# Patient Record
Sex: Female | Born: 1965 | Race: White | Hispanic: No | Marital: Married | Smoking: Former smoker
Health system: Southern US, Community
[De-identification: ages and names within clinical notes are randomized; demographics above are authoritative.]

## PROBLEM LIST (undated history)

## (undated) DIAGNOSIS — F32A Depression, unspecified: Secondary | ICD-10-CM

## (undated) DIAGNOSIS — T7840XA Allergy, unspecified, initial encounter: Secondary | ICD-10-CM

## (undated) DIAGNOSIS — F1010 Alcohol abuse, uncomplicated: Secondary | ICD-10-CM

## (undated) DIAGNOSIS — F17200 Nicotine dependence, unspecified, uncomplicated: Secondary | ICD-10-CM

## (undated) DIAGNOSIS — G4700 Insomnia, unspecified: Secondary | ICD-10-CM

## (undated) DIAGNOSIS — E079 Disorder of thyroid, unspecified: Secondary | ICD-10-CM

## (undated) DIAGNOSIS — F419 Anxiety disorder, unspecified: Secondary | ICD-10-CM

## (undated) DIAGNOSIS — K219 Gastro-esophageal reflux disease without esophagitis: Secondary | ICD-10-CM

## (undated) DIAGNOSIS — N63 Unspecified lump in unspecified breast: Secondary | ICD-10-CM

## (undated) DIAGNOSIS — F329 Major depressive disorder, single episode, unspecified: Secondary | ICD-10-CM

## (undated) DIAGNOSIS — D239 Other benign neoplasm of skin, unspecified: Secondary | ICD-10-CM

## (undated) DIAGNOSIS — R1013 Epigastric pain: Secondary | ICD-10-CM

## (undated) DIAGNOSIS — G43719 Chronic migraine without aura, intractable, without status migrainosus: Principal | ICD-10-CM

## (undated) DIAGNOSIS — K3189 Other diseases of stomach and duodenum: Secondary | ICD-10-CM

## (undated) HISTORY — DX: Anxiety disorder, unspecified: F41.9

## (undated) HISTORY — DX: Alcohol abuse, uncomplicated: F10.10

## (undated) HISTORY — DX: Allergy, unspecified, initial encounter: T78.40XA

## (undated) HISTORY — DX: Gastro-esophageal reflux disease without esophagitis: K21.9

## (undated) HISTORY — DX: Other benign neoplasm of skin, unspecified: D23.9

## (undated) HISTORY — DX: Unspecified lump in unspecified breast: N63.0

## (undated) HISTORY — DX: Major depressive disorder, single episode, unspecified: F32.9

## (undated) HISTORY — DX: Nicotine dependence, unspecified, uncomplicated: F17.200

## (undated) HISTORY — DX: Chronic migraine without aura, intractable, without status migrainosus: G43.719

## (undated) HISTORY — DX: Other diseases of stomach and duodenum: K31.89

## (undated) HISTORY — DX: Insomnia, unspecified: G47.00

## (undated) HISTORY — DX: Disorder of thyroid, unspecified: E07.9

## (undated) HISTORY — DX: Depression, unspecified: F32.A

## (undated) HISTORY — DX: Epigastric pain: R10.13

---

## 1997-09-08 ENCOUNTER — Emergency Department (HOSPITAL_COMMUNITY): Admission: EM | Admit: 1997-09-08 | Discharge: 1997-09-09 | Payer: Self-pay | Admitting: Emergency Medicine

## 1997-09-25 ENCOUNTER — Other Ambulatory Visit: Admission: RE | Admit: 1997-09-25 | Discharge: 1997-09-25 | Payer: Self-pay | Admitting: Gynecology

## 1998-04-22 ENCOUNTER — Emergency Department (HOSPITAL_COMMUNITY): Admission: EM | Admit: 1998-04-22 | Discharge: 1998-04-22 | Payer: Self-pay | Admitting: *Deleted

## 2001-07-04 ENCOUNTER — Emergency Department (HOSPITAL_COMMUNITY): Admission: EM | Admit: 2001-07-04 | Discharge: 2001-07-04 | Payer: Self-pay | Admitting: Emergency Medicine

## 2004-06-17 ENCOUNTER — Encounter: Admission: RE | Admit: 2004-06-17 | Discharge: 2004-06-17 | Payer: Self-pay | Admitting: Family Medicine

## 2004-08-17 ENCOUNTER — Other Ambulatory Visit: Admission: RE | Admit: 2004-08-17 | Discharge: 2004-08-17 | Payer: Self-pay | Admitting: Obstetrics and Gynecology

## 2005-06-29 ENCOUNTER — Ambulatory Visit (HOSPITAL_COMMUNITY): Admission: RE | Admit: 2005-06-29 | Discharge: 2005-06-29 | Payer: Self-pay | Admitting: Obstetrics and Gynecology

## 2005-07-11 ENCOUNTER — Emergency Department (HOSPITAL_COMMUNITY): Admission: EM | Admit: 2005-07-11 | Discharge: 2005-07-11 | Payer: Self-pay | Admitting: Emergency Medicine

## 2005-07-19 ENCOUNTER — Encounter: Admission: RE | Admit: 2005-07-19 | Discharge: 2005-07-19 | Payer: Self-pay | Admitting: Gastroenterology

## 2005-09-08 ENCOUNTER — Other Ambulatory Visit: Admission: RE | Admit: 2005-09-08 | Discharge: 2005-09-08 | Payer: Self-pay | Admitting: Obstetrics and Gynecology

## 2006-01-03 ENCOUNTER — Encounter: Admission: RE | Admit: 2006-01-03 | Discharge: 2006-01-03 | Payer: Self-pay | Admitting: Obstetrics and Gynecology

## 2006-03-06 ENCOUNTER — Emergency Department (HOSPITAL_COMMUNITY): Admission: EM | Admit: 2006-03-06 | Discharge: 2006-03-06 | Payer: Self-pay | Admitting: Emergency Medicine

## 2006-04-11 HISTORY — PX: ABDOMINAL HYSTERECTOMY: SHX81

## 2007-01-09 ENCOUNTER — Other Ambulatory Visit: Admission: RE | Admit: 2007-01-09 | Discharge: 2007-01-09 | Payer: Self-pay | Admitting: Obstetrics and Gynecology

## 2007-02-02 ENCOUNTER — Encounter: Admission: RE | Admit: 2007-02-02 | Discharge: 2007-02-02 | Payer: Self-pay | Admitting: Family Medicine

## 2007-03-27 ENCOUNTER — Ambulatory Visit (HOSPITAL_BASED_OUTPATIENT_CLINIC_OR_DEPARTMENT_OTHER): Admission: RE | Admit: 2007-03-27 | Discharge: 2007-03-27 | Payer: Self-pay | Admitting: Obstetrics and Gynecology

## 2007-03-27 ENCOUNTER — Encounter: Payer: Self-pay | Admitting: Obstetrics and Gynecology

## 2008-02-20 ENCOUNTER — Encounter: Admission: RE | Admit: 2008-02-20 | Discharge: 2008-02-20 | Payer: Self-pay | Admitting: Family Medicine

## 2008-02-20 ENCOUNTER — Ambulatory Visit: Payer: Self-pay | Admitting: Obstetrics and Gynecology

## 2008-02-20 ENCOUNTER — Encounter: Payer: Self-pay | Admitting: Obstetrics and Gynecology

## 2008-02-20 ENCOUNTER — Other Ambulatory Visit: Admission: RE | Admit: 2008-02-20 | Discharge: 2008-02-20 | Payer: Self-pay | Admitting: Obstetrics and Gynecology

## 2008-03-14 ENCOUNTER — Encounter: Admission: RE | Admit: 2008-03-14 | Discharge: 2008-03-14 | Payer: Self-pay | Admitting: Family Medicine

## 2008-04-29 ENCOUNTER — Ambulatory Visit: Payer: Self-pay | Admitting: *Deleted

## 2008-04-29 ENCOUNTER — Ambulatory Visit: Payer: Self-pay | Admitting: Cardiovascular Disease

## 2008-04-29 ENCOUNTER — Observation Stay (HOSPITAL_COMMUNITY): Admission: EM | Admit: 2008-04-29 | Discharge: 2008-04-30 | Payer: Self-pay | Admitting: Emergency Medicine

## 2008-04-30 ENCOUNTER — Encounter (INDEPENDENT_AMBULATORY_CARE_PROVIDER_SITE_OTHER): Payer: Self-pay | Admitting: *Deleted

## 2010-03-03 ENCOUNTER — Encounter: Admission: RE | Admit: 2010-03-03 | Discharge: 2010-03-03 | Payer: Self-pay | Admitting: Family Medicine

## 2010-05-02 ENCOUNTER — Encounter: Payer: Self-pay | Admitting: Family Medicine

## 2010-07-26 LAB — CBC
HCT: 41.5 % (ref 36.0–46.0)
HCT: 44.2 % (ref 36.0–46.0)
Hemoglobin: 13.8 g/dL (ref 12.0–15.0)
MCHC: 33.2 g/dL (ref 30.0–36.0)
MCHC: 33.4 g/dL (ref 30.0–36.0)
MCV: 90.8 fL (ref 78.0–100.0)
Platelets: 308 10*3/uL (ref 150–400)
RDW: 13.1 % (ref 11.5–15.5)
RDW: 13.7 % (ref 11.5–15.5)
WBC: 11.6 10*3/uL — ABNORMAL HIGH (ref 4.0–10.5)

## 2010-07-26 LAB — LIPID PANEL
HDL: 40 mg/dL (ref >39)
LDL Cholesterol: 135 mg/dL — ABNORMAL HIGH (ref 0–99)
Total CHOL/HDL Ratio: 5.2 RATIO
Triglycerides: 156 mg/dL — ABNORMAL HIGH (ref <150)
VLDL: 31 mg/dL (ref 0–40)

## 2010-07-26 LAB — POCT I-STAT, CHEM 8
BUN: 7 mg/dL (ref 6–23)
Creatinine, Ser: 0.6 mg/dL (ref 0.4–1.2)
Glucose, Bld: 79 mg/dL (ref 70–99)
Potassium: 3.1 mEq/L — ABNORMAL LOW (ref 3.5–5.1)
Sodium: 137 mEq/L (ref 135–145)

## 2010-07-26 LAB — CARDIAC PANEL(CRET KIN+CKTOT+MB+TROPI)
CK, MB: 0.9 ng/mL (ref 0.3–4.0)
Relative Index: INVALID (ref 0.0–2.5)
Total CK: 36 U/L (ref 7–177)
Total CK: 41 U/L (ref 7–177)
Troponin I: <0.01 ng/mL (ref 0.00–0.06)

## 2010-07-26 LAB — POCT CARDIAC MARKERS
CKMB, poc: <1 ng/mL — ABNORMAL LOW (ref 1.0–8.0)
Myoglobin, poc: 36.5 ng/mL (ref 12–200)
Troponin i, poc: <0.05 ng/mL (ref 0.00–0.09)

## 2010-07-26 LAB — DIFFERENTIAL
Eosinophils Absolute: 0.1 10*3/uL (ref 0.0–0.7)
Eosinophils Relative: 1 % (ref 0–5)
Lymphs Abs: 3.2 10*3/uL (ref 0.7–4.0)

## 2010-07-26 LAB — CULTURE, BLOOD (ROUTINE X 2)
Culture: NO GROWTH
Culture: NO GROWTH

## 2010-07-26 LAB — BASIC METABOLIC PANEL
Calcium: 9.2 mg/dL (ref 8.4–10.5)
GFR calc non Af Amer: >60 mL/min (ref >60)
Glucose, Bld: 86 mg/dL (ref 70–99)
Sodium: 138 mEq/L (ref 135–145)

## 2010-07-26 LAB — TROPONIN I: Troponin I: 0.03 ng/mL (ref 0.00–0.06)

## 2010-07-26 LAB — HEPATIC FUNCTION PANEL
ALT: 9 U/L (ref 0–35)
AST: 17 U/L (ref 0–37)
Alkaline Phosphatase: 65 U/L (ref 39–117)
Bilirubin, Direct: 0.1 mg/dL (ref 0.0–0.3)
Indirect Bilirubin: 0.5 mg/dL (ref 0.3–0.9)
Total Bilirubin: 0.6 mg/dL (ref 0.3–1.2)

## 2010-07-26 LAB — CK TOTAL AND CKMB (NOT AT ARMC): Relative Index: INVALID (ref 0.0–2.5)

## 2010-07-26 LAB — TSH: TSH: 1.9 u[IU]/mL (ref 0.350–4.500)

## 2010-07-26 LAB — D-DIMER, QUANTITATIVE: D-Dimer, Quant: 0.31 ug/mL-FEU (ref 0.00–0.48)

## 2010-07-26 LAB — GLUCOSE, CAPILLARY

## 2010-07-26 LAB — MAGNESIUM: Magnesium: 2.3 mg/dL (ref 1.5–2.5)

## 2010-08-24 NOTE — Op Note (Signed)
NAME:  Amber Gibbs, Amber Gibbs              ACCOUNT NO.:  [id number]   MEDICAL RECORD NO.:  [id number]          PATIENT TYPE:  AMB   LOCATION:  [facility]                         FACILITY:  [facility]   PHYSICIAN:  Daniel L. Gottsegen, M.D.DATE OF BIRTH:  April 11, 1966   DATE OF PROCEDURE:  03/27/2007  DATE OF DISCHARGE:                               OPERATIVE REPORT   PREOPERATIVE DIAGNOSES:  1. Pelvic pain.  2. Chronic pelvic inflammatory disease.  3. Dysmenorrhea.  4. Menorrhagia.  5. Probable hydrosalpinx.   POSTOPERATIVE DIAGNOSES:  1. Pelvic pain.  2. Chronic pelvic inflammatory disease.  3. Dysmenorrhea.  4. Menorrhagia.  5. Bilateral hydrosalpinx.   OPERATIONS:  1. Laparoscopic-assisted vaginal hysterectomy.  2. Right salpingo-oophorectomy.  3. Left salpingectomy.   SURGEON:  Daniel L. Eda Paschal, M.D.   FIRST ASSISTANT:  Timothy P. Fontaine, M.D.   INDICATIONS:  The patient is a 46 year old nulligravida who, over the  past year, has suffered from chronic pelvic pain, especially prominent  on the right side.  On ultrasound several times she has had evidence of  bilateral hydrosalpinx.  She has had a complete GI and GU evaluation  without any other explanations for the pain.  In addition, she suffers  with severe dysmenorrhea and menorrhagia, does not want to conceive and  wants definitive surgery for treatment of all the above.  She enters the  hospital for diagnostic laparoscopy, laparoscopic-assisted vaginal  hysterectomy, right salpingo-oophorectomy, left salpingectomy if she  indeed has bilateral hydrosalpinx and left salpingo-oophorectomy if we  cannot remove her left tube with out leaving disease with the ovary.   FINDINGS:  At the time of surgery, omentum was densely adherent to the  top of the uterus.  This, I am sure, contributed to a lot of the  patient's pain.  The right ovary and tube were bound down by pelvic  adhesive disease as well as a right hydrosalpinx.  The  appendix was  actually in the pelvis and was somewhat adherent to the broad ligament,  but the appendix itself looked normal and once it was freed up, there  was no evidence of any acute appendicitis.  The left ovary appeared  normal.  The left tube ended in the hydrosalpinx.   DESCRIPTION OF PROCEDURE:  After adequate general endotracheal  anesthesia, the patient was placed in the dorsal lithotomy position,  prepped and draped in the usual sterile manner.  A Foley catheter was  inserted in the patient's bladder.  A Hulka catheter was placed in the  uterus.  A pneumoperitoneum was started by direct insertion of the  OptiVu 10-mm trocar subumbilically through a small vertical incision.  Then, under direct visualization, two 5-mm ports were placed in the  right and left lower quadrant for additional instrumentation.  Trauma  was not incurred on insertion as all went in well.  Later in the  procedure, a third 5-mm trocar was placed suprapubically for additional  instrumentation.  The uterus was elevated.   The first thing that was done was the omentum was dissected off the  uterus using bipolar coagulation and cutting.  The right adnexa  was  freed up and the ureter was identified.  The right IP ligament was  bipolared and cut and the attachments of the ovary and the hydrosalpinx  and the right tube to the broad ligament were completely freed up  without trauma.  The appendix was freed up where it had adhered and it  was just loosely adhered and there was no evidence of appendicitis.  A  vesicouterine fold of peritoneum was then started on the right side.   Attention was next turned to the left side.  The hydrosalpinx in the  left fallopian tube could be isolated from the ovary so we could  preserve her ovary which was the patient's wishes at 41 to prevent need  for hormone replacement.  The hydrosalpinx was inadvertently ruptured  during the dissection.  The surgeon's and the assistant's  impression was  that the entire left tube had been removed, although because of the  adhesive disease on the left, it is certainly possible that a small  portion of the left tube was left.  The left ovary appeared healthy  after it was left.  The utero-ovarian ligament on the left and the round  ligament on the left were bipolared and cut.  It was not mentioned  above, but the round ligament on the right was also bipolared and cut  and the bladder flap was more fully developed until the uterus was now  completely free of both omentum and adhesive disease.   Attention was next turned vaginally.  A 1:200,000 solution of  epinephrine and 0.50% Xylocaine was injected around the cervix.  A 360-  degree incision was made around the cervix.  The bladder was mobilized  superiorly as was the posterior peritoneum.  The posterior peritoneum  and vesicouterine fold of peritoneum were entered by sharp dissection.  The uterosacral ligaments were clamped and cut.  In clamping them, they  were shortened and then they were sutured to the vault laterally for  good vault support.  Uterine arteries, cardinal ligaments, balance of  the broad ligament were all clamped and cut and #1 chromic catgut was  utilized for all suture material.  The uterus was now free.  It was  delivered along with the right ovary and tube and the left tube and sent  to pathology for tissue diagnosis separately.  The vaginal cuff was  stitched to the peritoneum with a running locking 0 Vicryl.  Copious  irrigation was done.  Two sponge, needle, and instrument counts were  correct.  The cul-de-sac was small and it was not felt appropriate to  use a Moschcowitz suture  but when the cuff and the peritoneum were  closed, the cul-de-sac was brought together in doing that too, to  eliminate a future enterocele.  The cuff and peritoneum were then closed  with #1 chromic catgut.   The surgeons regloved.  They went back laparoscopically and  looked.  The  entire case appeared to be completely dry without any bleeding except  for an area where the omentum had been adherent and the left  hydrosalpinx had been adherent.  This area was carefully watched.  There  was a bleeder.  Care was taken to avoid injury to either bowel or ureter  and the bleeder was controlled.  At this point there was no bleeding.  The procedure was terminated.  All trocars were removed.  The  pneumoperitoneum was evacuated.  The subumbilical fascial incision was  closed with 0 Vicryl and  then all four skin incisions were closed with  Dermabond.  Blood loss was 350 mL with none replaced.  The patient  tolerated procedure well and left the operating room in satisfactory  condition draining clear urine from her Foley catheter.      Daniel L. Eda Paschal, M.D.  Electronically Signed    DLG/MEDQ  D:  03/27/2007  T:  03/27/2007  Job:  [id number]

## 2010-08-27 NOTE — Discharge Summary (Signed)
NAME:  Amber Gibbs, Amber Gibbs              ACCOUNT NO.:  [id number]   MEDICAL RECORD NO.:  [id number]          PATIENT TYPE:  OBV   LOCATION:  3703                         FACILITY:  [facility]   PHYSICIAN:  Manning Charity, MD     DATE OF BIRTH:  04/25/1965   DATE OF ADMISSION:  04/29/2008  DATE OF DISCHARGE:  04/30/2008                               DISCHARGE SUMMARY   CONTINUITY DOCTOR:  Dr. Pierre Bali   CONSULTANTS:  None.   DISCHARGE DIAGNOSIS:  Atypical chest pain.   OTHER DIAGNOSES:  1. Chronic pelvic inflammatory disease.  2. History of dysmenorrhea.  3. Bilateral hydrosalpinx.  4. History of pneumonia November 2007.  5. History of hypothyroidism.  6. History of left salpingectomy.  7. History of right salpingo-oophorectomy.  8. History of vaginal hysterectomy.  9. History of simple cyst, right upper breast by ultrasound 2009,      follow with Dr. Katrinka Blazing.  10.History of anxiety.   DISCHARGE MEDICATIONS:  1. Synthroid 88 mcg 1 tablet by mouth daily.  2. Ambien 10 mg at bedtime as needed.  3. Xanax 1 mg 1 tablet by mouth 3 times a day.  4. Albuterol 90 mcg spray 2 puffs inhale q.6 h. as needed.   DISPOSITION ON FOLLOWUP:  Ms. Lindenbaum will follow up with Dr. Katrinka Blazing as an  outpatient.  During that appointment, resolution of chest pain need be  followed.   PROCEDURE PERFORMED:  A 2-D echo, overall left ventricular systolic  function was normal.  Left ventricular ejection fraction was 55%.  No  wall motion abnormality.   HISTORY OF PRESENT ILLNESS:  This is a 45 year old with past medical  history significant for hypothyroidism and anxiety who presented to  emergency department after an episode of chest pain, hyperventilation,  and shortness of breath.  She was taking a shower when she started  having sudden onset of chest tightness 5/10 intensity, constant, non  radiating associated with nausea, lightheadedness and dyspnea.  She  denies shortness of breath on exertion or chest  pain on exertion.  Chest  pain is pleuritic in nature.  She has had prior episode of chest pain in  2007.  During that time, she was diagnosed with pneumonia.  She has  sinus infection and cough since one week ago, she took Avelox, last dose  was last Thursday.  She relates that the cough is getting better.  She  also relates chills that started today.  She also had 1 episode of  palpitation.  She has history of anxiety and she takes Xanax.   ALLERGIES:  To CODEINE, she gets hives and SULFA intolerance vomiting.   PHYSICAL EXAMINATION:  VITAL SIGNS:  Temperature 98.4, blood pressure  119/80, pulse 67, respirations 26, and sats 99% on 2 L.  GENERAL:  The patient was in no acute distress.  Speaking in full  sentences.  EYES:  Pupils equal and reactive to light, anicteric.  ENT:  No tonsillar enlargement or erythema.  NECK:  Supple.  No thyromegaly.  No bruits.  RESPIRATION:  Clear to auscultation.  No wheezing, no rales, no rhonchi.  CARDIOVASCULAR:  S1 and S2 normal.  Regular rhythm and rate.  No murmur.  GASTROINTESTINAL:  Bowel sounds positive.  Soft, nontender,  nondistended.  No rigidity.  No guarding.  EXTREMITIES:  Pulse positive.  No edema.  LYMPH:  No lymphadenopathy.  MS:  No joint deformity.  NEUROLOGIC:  Alert and oriented x3.  Cranial nerves intact.  Motor  strength and sensation were intact.  PSYCHIATRY:  Appropriate.  The patient does not appear in anxiety.   ADMISSION LABORATORY DATA:  D-dimer 0.31.  Sodium 137, potassium 3.1,  chloride 103, bicarb 23, BUN 7, creatinine 0.6, and glucose 79.  White  blood cell 11.6, hemoglobin 14.8, platelet 308.  ANC 7.1, MCV 90.8,  troponin 0.05, CK-MB less than 1.0, myoglobin 38, and urine pregnancy  was negative.   HOSPITAL COURSE:  1. Atypical chest pain.  The patient was admitted to telemetry,      cardiac enzymes were negative x3.  EKG, sinus rhythm.  No ST      segment elevation or depression.  D-dimer was negative also on       likely pulmonary embolism.  A 2-D echo was negative, which result      as above.  Her chest pain could be secondary to GI in origin GERD      versus gastritis or it could be anxiety related. The patient was      started on albuterol in case of bronchospastic component.  The      patient was also started on aspirin.  TSH was ordered and it was      normal less likely angina secondary to hyperthyroidism.  UDS was      negative for any drugs.  The patient was also started on Protonix.      The patient on the day of discharge, her chest pain was better.      LDL 135, HDL 40, hemoglobin A1c was normal.   1. Leukocytosis.  This could be likely stress related.  Cause of the      infection were ruled out.  Chest x-ray was clear.  UA was negative.      Blood cultures and stools are negative.  The patient never spike a      fever during this admission and a white blood cell decreased to      10.7 on the day of discharge.   3 hypothyroidism On the first day we held the Synthroid, TSH T3, T4 were  within normal limits.  T4 1.34, TSH was 1.9, T3 obtained was 39, just 2  points above normal.  The patient was discharged on the same dose of her  Synthroid.   The patient was discharged in good condition.   DISCHARGE LABORATORY AND VITALS:  On the day of discharge vitals, blood  pressure 99/67, pulse 76, temperature 98, oxygen 96 on room air.  Sodium  138, potassium 4.1, chloride 105, bicarb 27, glucose 86, BUN 2,  creatinine 0.52.  White blood cell 10.7, hemoglobin 13.8, and platelet  267.      Hartley Barefoot, MD  Electronically Signed      Manning Charity, MD  Electronically Signed    BR/MEDQ  D:  05/12/2008  T:  05/13/2008  Job:  [id number]

## 2011-01-14 LAB — CBC
MCHC: 34.7
RDW: 12.9

## 2011-08-04 ENCOUNTER — Ambulatory Visit: Payer: Self-pay | Admitting: Family Medicine

## 2011-08-23 ENCOUNTER — Ambulatory Visit (INDEPENDENT_AMBULATORY_CARE_PROVIDER_SITE_OTHER): Payer: 59 | Admitting: Family Medicine

## 2011-08-23 DIAGNOSIS — J309 Allergic rhinitis, unspecified: Secondary | ICD-10-CM

## 2011-08-23 DIAGNOSIS — J019 Acute sinusitis, unspecified: Secondary | ICD-10-CM

## 2011-08-23 MED ORDER — AZITHROMYCIN 250 MG PO TABS: ORAL_TABLET | ORAL | Status: AC

## 2011-08-23 MED ORDER — FLUTICASONE PROPIONATE 50 MCG/ACT NA SUSP: 2.0000 | Freq: Every day | NASAL | Status: DC

## 2011-08-23 MED ORDER — ONDANSETRON 4 MG PO TBDP: 4.0000 mg | ORAL_TABLET | Freq: Three times a day (TID) | ORAL | Status: AC | PRN

## 2011-08-23 MED ORDER — HYDROCODONE-HOMATROPINE 5-1.5 MG/5ML PO SYRP: 5.0000 mL | ORAL_SOLUTION | Freq: Three times a day (TID) | ORAL | Status: AC | PRN

## 2011-08-23 NOTE — Progress Notes (Signed)
  Subjective:    Patient ID: Amber Gibbs, female    DOB: October 30, 1965, 46 y.o.   MRN: [id number]  HPI 46 yo female with URI/sinusitis symptoms. Thought was allergies but worsening instead of getting better.  Head congestion, cough, yellow-green phlegm from nose and cough.  Cough keeps her up at night.  Sore throat.  No fever.  Taking advil.  Out of flonase.  Takes allegra and astelin.  Mucinex not helping.     Review of Systems Negative except as per HPI     Objective:   Physical Exam  Constitutional: She appears well-developed. No distress.  HENT:  Right Ear: Tympanic membrane, external ear and ear canal normal. Tympanic membrane is not injected, not scarred, not perforated, not erythematous, not retracted and not bulging.  Left Ear: Tympanic membrane, external ear and ear canal normal. Tympanic membrane is not injected, not scarred, not perforated, not erythematous, not retracted and not bulging.  Nose: Mucosal edema present. No rhinorrhea. Right sinus exhibits no maxillary sinus tenderness and no frontal sinus tenderness. Left sinus exhibits maxillary sinus tenderness and frontal sinus tenderness.  Mouth/Throat: Uvula is midline, oropharynx is clear and moist and mucous membranes are normal. No oropharyngeal exudate or tonsillar abscesses.  Cardiovascular: Normal rate, regular rhythm, normal heart sounds and intact distal pulses.   No murmur heard. Pulmonary/Chest: Effort normal and breath sounds normal. No respiratory distress. She has no wheezes. She has no rales.  Lymphadenopathy:       Head (right side): No submandibular and no preauricular adenopathy present.       Head (left side): No submandibular and no preauricular adenopathy present.       Right cervical: No superficial cervical and no posterior cervical adenopathy present.      Left cervical: No superficial cervical and no posterior cervical adenopathy present.       Right: No supraclavicular adenopathy present.   Left: No supraclavicular adenopathy present.  Skin: Skin is warm and dry.          Assessment & Plan:  Allergies, sinusitis - resume flonase.  Zpak.  Hycodan.  zoran (if hycodan nauseates her).

## 2012-01-18 ENCOUNTER — Ambulatory Visit (INDEPENDENT_AMBULATORY_CARE_PROVIDER_SITE_OTHER): Payer: 59 | Admitting: Family Medicine

## 2012-01-18 ENCOUNTER — Encounter: Payer: Self-pay | Admitting: Family Medicine

## 2012-01-18 VITALS — BP 115/79 | HR 58 | Temp 98.0°F | Resp 16 | Ht 68.5 in | Wt 141.0 lb

## 2012-01-18 DIAGNOSIS — J0100 Acute maxillary sinusitis, unspecified: Secondary | ICD-10-CM

## 2012-01-18 DIAGNOSIS — E039 Hypothyroidism, unspecified: Secondary | ICD-10-CM

## 2012-01-18 DIAGNOSIS — G4700 Insomnia, unspecified: Secondary | ICD-10-CM

## 2012-01-18 DIAGNOSIS — J309 Allergic rhinitis, unspecified: Secondary | ICD-10-CM

## 2012-01-18 DIAGNOSIS — F411 Generalized anxiety disorder: Secondary | ICD-10-CM

## 2012-01-18 DIAGNOSIS — F419 Anxiety disorder, unspecified: Secondary | ICD-10-CM

## 2012-01-18 LAB — CBC WITH DIFFERENTIAL/PLATELET
Eosinophils Absolute: 0.1 10*3/uL (ref 0.0–0.7)
HCT: 42.1 % (ref 36.0–46.0)
Hemoglobin: 15 g/dL (ref 12.0–15.0)
Lymphs Abs: 3.1 10*3/uL (ref 0.7–4.0)
MCH: 32.1 pg (ref 26.0–34.0)
MCV: 90.1 fL (ref 78.0–100.0)
Monocytes Absolute: 0.6 10*3/uL (ref 0.1–1.0)
Monocytes Relative: 8 % (ref 3–12)
Neutrophils Relative %: 50 % (ref 43–77)
RBC: 4.67 MIL/uL (ref 3.87–5.11)

## 2012-01-18 LAB — BASIC METABOLIC PANEL
BUN: 4 mg/dL — ABNORMAL LOW (ref 6–23)
CO2: 31 mEq/L (ref 19–32)
Calcium: 10.4 mg/dL (ref 8.4–10.5)
Glucose, Bld: 91 mg/dL (ref 70–99)
Sodium: 140 mEq/L (ref 135–145)

## 2012-01-18 MED ORDER — ONDANSETRON 8 MG PO TBDP: 8.0000 mg | ORAL_TABLET | Freq: Three times a day (TID) | ORAL | Status: DC | PRN

## 2012-01-18 MED ORDER — ALPRAZOLAM 1 MG PO TABS: 1.0000 mg | ORAL_TABLET | Freq: Three times a day (TID) | ORAL | Status: DC | PRN

## 2012-01-18 MED ORDER — PREDNISONE 20 MG PO TABS: ORAL_TABLET | ORAL | Status: DC

## 2012-01-18 MED ORDER — MOXIFLOXACIN HCL 400 MG PO TABS: 400.0000 mg | ORAL_TABLET | Freq: Every day | ORAL | Status: DC

## 2012-01-18 MED ORDER — ZOLPIDEM TARTRATE 10 MG PO TABS: 10.0000 mg | ORAL_TABLET | Freq: Every evening | ORAL | Status: DC | PRN

## 2012-01-18 MED ORDER — LEVOTHYROXINE SODIUM 88 MCG PO TABS: 88.0000 ug | ORAL_TABLET | Freq: Every day | ORAL | Status: DC

## 2012-01-18 NOTE — Progress Notes (Signed)
[address]   [place], [place]  [address]   [phone]  Subjective:    Patient ID: Amber Gibbs, female    DOB: August 08, 1965, 46 y.o.   MRN: [id number]  HPIThis 46 y.o. female presents to establish care and for follow-up six months:  1.  Anxiety:  Son accepted to higher level of care; locked facility in [place] on 08/11/11; first month was horrible; 23 restraints in first month; since August, one restraint.  Talking with therapist; making straight As in school.  Amazing facility.  Getting paperwork for discharge to home.  Now in middle school; wants him to return to treatment school. Older son went to [employer]; senior in high school; going to [employer] for advanced training for 3-6 months; plans to start [employer] or [place].  Xanax three times daily.    2.  Allergic Rhinitis:  Has not felt well for 8 weeks; went to [facility]; placed on Levaquin x 10 days; finished three weeks ago.  Started feeling better for one week and then worsened.  +intermittent fever; low grade fever; feels worse as day goes on.  +HA; +periorbital region; +teeth pain; +sinus pain.  Had to take Zofran with Levaquin.  +PND.  +hoarseness.  L ear pain always.  Congestion green-yellow-clear; not really thick; mild cough.  Astelin, Flonase, Allegra.   3.  Insomnia:  Sleeping with Ambien.  No issues with insomnia since stressors at home have improved.    4. Hypercalcemia:  New.  Calcium of 10.4 at last visit; presenting for repeat today.  No symptoms; feeling well.   5.  Hypothyroidism: needs refill of medication; asymptomatic.  TSH six months ago normal. Compliance with daily Synthroid.   Review of Systems  Constitutional: Positive for fever, chills and fatigue. Negative for diaphoresis.  HENT: Positive for ear pain, congestion, rhinorrhea, sneezing, postnasal drip and sinus pressure. Negative for hearing loss and dental problem.   Eyes: Negative for photophobia and visual disturbance.  Respiratory:  Positive for cough. Negative for shortness of breath.   Gastrointestinal: Negative for nausea, vomiting, abdominal pain and diarrhea.  Neurological: Positive for headaches. Negative for dizziness and light-headedness.  Psychiatric/Behavioral: Positive for disturbed wake/sleep cycle. Negative for self-injury and dysphoric mood. The patient is nervous/anxious.        Objective:   Physical Exam  Nursing note and vitals reviewed. Constitutional: She is oriented to person, place, and time. She appears well-developed and well-nourished. No distress.  HENT:  Head: Normocephalic and atraumatic.  Right Ear: External ear normal.  Left Ear: External ear normal.  Nose: Nose normal.  Mouth/Throat: Oropharynx is clear and moist.       +TTP B MAXILLARY SINUSES, FRONTAL SINUS.  Eyes: Conjunctivae normal and EOM are normal. Pupils are equal, round, and reactive to light.  Neck: Normal range of motion. Neck supple. No thyromegaly present.  Cardiovascular: Normal rate, regular rhythm, normal heart sounds and intact distal pulses.   No murmur heard. Pulmonary/Chest: Effort normal and breath sounds normal.  Lymphadenopathy:    She has no cervical adenopathy.  Neurological: She is alert and oriented to person, place, and time.  Skin: She is not diaphoretic.  Psychiatric: She has a normal mood and affect. Her behavior is normal. Judgment and thought content normal.       Assessment & Plan:   1. Anxiety  CBC with Differential, Basic metabolic panel  2. Allergic rhinitis, cause unspecified  CBC with Differential, Basic metabolic panel  3. Sinusitis,  acute, maxillary  moxifloxacin (AVELOX) 400 MG tablet, predniSONE (DELTASONE) 20 MG tablet, ondansetron (ZOFRAN-ODT) 8 MG disintegrating tablet, CBC with Differential, Basic metabolic panel  4. Insomnia  zolpidem (AMBIEN) 10 MG tablet, CBC with Differential, Basic metabolic panel  5. Hypothyroidism  levothyroxine (SYNTHROID, LEVOTHROID) 88 MCG tablet, CBC  with Differential, Basic metabolic panel  6. Hypercalcemia  CBC with Differential, Basic metabolic panel     1.  Anxiety: improved in past six months; no change in management; refill for Xanax provided for six months. 2.  Allergic rhinitis:  Worsening; no change in daily management.  Continue Astelin, Flonase, Allegra. Rx for Prednisone taper provided. 3.  Acute sinusitis: New.  S/p Levaquin last month; rx for Avelox and Prednisone taper provided.  To call in two weeks if no improvement; rx for Zofran also provided for nausea related to antibiotic ingestion. 4.  Insomnia:  Controlled; refill of Ambien provided. 5.  Hypothyroidism: controlled; normal labs at last visit.  Refill provided. 6.  Hypercalcemia: new.  Asymptomatic; repeat today.

## 2012-01-18 NOTE — Patient Instructions (Addendum)
1. Anxiety    2. Allergic rhinitis, cause unspecified    3. Sinusitis, acute, maxillary  moxifloxacin (AVELOX) 400 MG tablet, predniSONE (DELTASONE) 20 MG tablet, ondansetron (ZOFRAN-ODT) 8 MG disintegrating tablet  4. Insomnia  zolpidem (AMBIEN) 10 MG tablet  5. Hypothyroidism  levothyroxine (SYNTHROID, LEVOTHROID) 88 MCG tablet  6. Hypercalcemia

## 2012-01-19 ENCOUNTER — Telehealth: Payer: Self-pay | Admitting: *Deleted

## 2012-01-19 ENCOUNTER — Encounter: Payer: Self-pay | Admitting: Family Medicine

## 2012-01-19 DIAGNOSIS — E039 Hypothyroidism, unspecified: Secondary | ICD-10-CM

## 2012-01-19 NOTE — Telephone Encounter (Signed)
[facility] called and stated that they could not do test for PTH intact with calcium test.  I called and left message for pt to call back.  [facility] or she can come by here and we can redraw at no charge.  [facility] to [facility] we have to fax [facility] a requisition to [facility] from [facility] the [facility].

## 2012-01-20 NOTE — Progress Notes (Signed)
Reviewed and agree.

## 2012-01-20 NOTE — Telephone Encounter (Signed)
Pt will be in tom to have her blood redrawn for PTH intact with calcium.

## 2012-01-21 ENCOUNTER — Other Ambulatory Visit (INDEPENDENT_AMBULATORY_CARE_PROVIDER_SITE_OTHER): Payer: 59 | Admitting: Family Medicine

## 2012-01-21 DIAGNOSIS — E039 Hypothyroidism, unspecified: Secondary | ICD-10-CM

## 2012-01-23 ENCOUNTER — Encounter: Payer: Self-pay | Admitting: *Deleted

## 2012-01-24 ENCOUNTER — Encounter: Payer: Self-pay | Admitting: Family Medicine

## 2012-01-26 ENCOUNTER — Encounter: Payer: Self-pay | Admitting: Family Medicine

## 2012-02-02 ENCOUNTER — Telehealth: Payer: Self-pay

## 2012-02-02 NOTE — Telephone Encounter (Signed)
Pt would like to speak with someone about getting two of her prescriptions refilled, she believes that why they have not been filled from two days ago when the pharmacy started requesting is because it was written for a year and expired on 01/24/12 and she requested the refill on 01/26/12  Best number [phone]

## 2012-02-03 MED ORDER — AZELASTINE HCL 0.1 % NA SOLN: 1.0000 | Freq: Two times a day (BID) | NASAL | Status: DC

## 2012-02-03 NOTE — Telephone Encounter (Signed)
Notified pt Rx was sent to pharmacy.

## 2012-02-03 NOTE — Telephone Encounter (Signed)
Called patient, left message for her to call me back

## 2012-02-03 NOTE — Telephone Encounter (Signed)
Rx sent to pharmacy   

## 2012-02-03 NOTE — Telephone Encounter (Signed)
Patient states she was here to see Dr Katrinka Blazing, and had refills on her Astelin, but the refills expired. I havve pended Rx, please sign for patient to get this medication, if not appropriate, let me know.

## 2012-05-06 ENCOUNTER — Ambulatory Visit: Payer: 59

## 2012-05-06 ENCOUNTER — Encounter: Payer: Self-pay | Admitting: Family Medicine

## 2012-05-06 ENCOUNTER — Ambulatory Visit (INDEPENDENT_AMBULATORY_CARE_PROVIDER_SITE_OTHER): Payer: 59 | Admitting: Family Medicine

## 2012-05-06 VITALS — BP 118/75 | HR 71 | Temp 98.5°F | Resp 18 | Ht 69.0 in | Wt 142.4 lb

## 2012-05-06 DIAGNOSIS — R509 Fever, unspecified: Secondary | ICD-10-CM

## 2012-05-06 DIAGNOSIS — R05 Cough: Secondary | ICD-10-CM

## 2012-05-06 DIAGNOSIS — J209 Acute bronchitis, unspecified: Secondary | ICD-10-CM

## 2012-05-06 DIAGNOSIS — R51 Headache: Secondary | ICD-10-CM

## 2012-05-06 DIAGNOSIS — J40 Bronchitis, not specified as acute or chronic: Secondary | ICD-10-CM

## 2012-05-06 LAB — POCT CBC
Lymph, poc: 3.3 (ref 0.6–3.4)
MCH, POC: 30.4 pg (ref 27–31.2)
MCHC: 32.2 g/dL (ref 31.8–35.4)
MID (cbc): 0.4 (ref 0–0.9)
MPV: 0.98 fL (ref 0–99.8)
POC LYMPH PERCENT: 50.7 %L — AB (ref 10–50)
POC MID %: 6.6 %M (ref 0–12)
Platelet Count, POC: 277 10*3/uL (ref 142–424)
RBC: 4.77 M/uL (ref 4.04–5.48)
RDW, POC: 13.2 %
WBC: 6.5 10*3/uL (ref 4.6–10.2)

## 2012-05-06 MED ORDER — HYDROCODONE-HOMATROPINE 5-1.5 MG/5ML PO SYRP: ORAL_SOLUTION | ORAL | Status: DC

## 2012-05-06 MED ORDER — ALBUTEROL SULFATE HFA 108 (90 BASE) MCG/ACT IN AERS: 2.0000 | INHALATION_SPRAY | Freq: Four times a day (QID) | RESPIRATORY_TRACT | Status: DC | PRN

## 2012-05-06 MED ORDER — BENZONATATE 100 MG PO CAPS: 100.0000 mg | ORAL_CAPSULE | Freq: Three times a day (TID) | ORAL | Status: DC | PRN

## 2012-05-06 MED ORDER — AZITHROMYCIN 250 MG PO TABS: ORAL_TABLET | ORAL | Status: DC

## 2012-05-06 NOTE — Progress Notes (Signed)
Subjective:    Patient ID: Amber Gibbs, female    DOB: 1965-07-08, 47 y.o.   MRN: [id number]  HPI Amber Gibbs is a 47 y.o. female About 2 weeks ago - tired, body aches, fever up to 103, along with a cough.  Has had some lower grade fevers since then. Fevers about 101-102 over past few days. 101.8 last night. Cough wheezing, headache, nausea with decreased po intake.  Feels short of breath at times. L ear pain - feels blocked - past few days.   Hx of hives with codeine.   Tx: no otc meds except Advil, and Delsym at night, also takes Ambien to sleep.    No flu vaccine this year.  Sick contacts at work -[employer].   Review of Systems  Constitutional: Positive for fever and chills.  HENT: Positive for congestion and sinus pressure.   Respiratory: Positive for cough (with green mucus at times. ), shortness of breath and wheezing.     As above.      Objective:   Physical Exam  Vitals reviewed. Constitutional: She is oriented to person, place, and time. She appears well-developed and well-nourished. No distress.  HENT:  Head: Normocephalic and atraumatic.  Right Ear: Hearing, tympanic membrane, external ear and ear canal normal.  Left Ear: Hearing, tympanic membrane, external ear and ear canal normal.  Nose: Nose normal.  Mouth/Throat: Oropharynx is clear and moist. No oropharyngeal exudate.  Eyes: Conjunctivae normal and EOM are normal. Pupils are equal, round, and reactive to light.  Cardiovascular: Normal rate, regular rhythm, normal heart sounds and intact distal pulses.   No murmur heard. Pulmonary/Chest: Effort normal and breath sounds normal. No respiratory distress. She has no wheezes. She has no rhonchi. She has no rales.       Coughs few times in office, but clear on exam.   Neurological: She is alert and oriented to person, place, and time.  Skin: Skin is warm and dry. No rash noted.  Psychiatric: She has a normal mood and affect. Her behavior is  normal.   Results for orders placed in visit on 05/06/12  POCT CBC      Component Value Range   WBC 6.5  4.6 - 10.2 K/uL   Lymph, poc 3.3  0.6 - 3.4   POC LYMPH PERCENT 50.7 (*) 10 - 50 %L   MID (cbc) 0.4  0 - 0.9   POC MID % 6.6  0 - 12 %M   POC Granulocyte 2.8  2 - 6.9   Granulocyte percent 42.7  37 - 80 %G   RBC 4.77  4.04 - 5.48 M/uL   Hemoglobin 14.5  12.2 - 16.2 g/dL   HCT, POC 40.9  81.1 - 47.9 %   MCV 94.4  80 - 97 fL   MCH, POC 30.4  27 - 31.2 pg   MCHC 32.2  31.8 - 35.4 g/dL   RDW, POC 91.4     Platelet Count, POC 277  142 - 424 K/uL   MPV 0.98  0 - 99.8 fL    [facility] reading (PRIMARY) by  Dr. Neva Seat: CXR: few increased rll markings, no discrete infiltrate.      Assessment & Plan:  Amber Gibbs is a 47 y.o. female 1. Cough  POCT CBC, DG Chest 2 View, benzonatate (TESSALON) 100 MG capsule, albuterol (PROVENTIL HFA;VENTOLIN HFA) 108 (90 BASE) MCG/ACT inhaler, HYDROcodone-homatropine (HYCODAN) 5-1.5 MG/5ML syrup  2. Fever  POCT CBC, DG Chest 2  View  3. Headache  POCT CBC  4. Bronchitis  azithromycin (ZITHROMAX) 250 MG tablet, benzonatate (TESSALON) 100 MG capsule   Likely post influenza bronchitis vs faint pneumonia. Reassuring CBC.  Start Zpak, tessalon tid prn, proair only if wheezing - technique discussed. Hycodan at night only if needed for cough - states she has taken this prior without difficulty, but ER/RTC precautions discussed. Out of work for 2 days.   Patient Instructions  Start the antibiotic, tessalon 3 times per day if needed for cough, hydrocodone syrup at night only if needed.  If you have wheezing - you can try the inhaler up to every 4-6 hours.  If not improving in next 3-4 days - return to clinic. Return to the clinic or go to the nearest emergency room if any of your symptoms worsen or new symptoms occur.

## 2012-05-06 NOTE — Patient Instructions (Signed)
Start the antibiotic, tessalon 3 times per day if needed for cough, hydrocodone syrup at night only if needed.  If you have wheezing - you can try the inhaler up to every 4-6 hours.  If not improving in next 3-4 days - return to clinic. Return to the clinic or go to the nearest emergency room if any of your symptoms worsen or new symptoms occur.

## 2012-05-11 ENCOUNTER — Ambulatory Visit (INDEPENDENT_AMBULATORY_CARE_PROVIDER_SITE_OTHER): Payer: 59 | Admitting: Family Medicine

## 2012-05-11 ENCOUNTER — Encounter: Payer: Self-pay | Admitting: Family Medicine

## 2012-05-11 VITALS — BP 110/66 | HR 60 | Temp 98.0°F | Resp 16 | Ht 68.5 in | Wt 144.4 lb

## 2012-05-11 DIAGNOSIS — J0100 Acute maxillary sinusitis, unspecified: Secondary | ICD-10-CM

## 2012-05-11 DIAGNOSIS — J329 Chronic sinusitis, unspecified: Secondary | ICD-10-CM

## 2012-05-11 DIAGNOSIS — J019 Acute sinusitis, unspecified: Secondary | ICD-10-CM

## 2012-05-11 DIAGNOSIS — R110 Nausea: Secondary | ICD-10-CM

## 2012-05-11 DIAGNOSIS — R05 Cough: Secondary | ICD-10-CM

## 2012-05-11 DIAGNOSIS — R509 Fever, unspecified: Secondary | ICD-10-CM

## 2012-05-11 LAB — POCT CBC
Granulocyte percent: 56 %G (ref 37–80)
HCT, POC: 45.2 % (ref 37.7–47.9)
Hemoglobin: 14.4 g/dL (ref 12.2–16.2)
MCV: 92.7 fL (ref 80–97)
RBC: 4.88 M/uL (ref 4.04–5.48)

## 2012-05-11 MED ORDER — HYDROCODONE-HOMATROPINE 5-1.5 MG/5ML PO SYRP: ORAL_SOLUTION | ORAL | Status: DC

## 2012-05-11 MED ORDER — ONDANSETRON 8 MG PO TBDP: 8.0000 mg | ORAL_TABLET | Freq: Three times a day (TID) | ORAL | Status: DC | PRN

## 2012-05-11 MED ORDER — AMOXICILLIN-POT CLAVULANATE 875-125 MG PO TABS: 1.0000 | ORAL_TABLET | Freq: Two times a day (BID) | ORAL | Status: DC

## 2012-05-11 NOTE — Patient Instructions (Addendum)
Can start Augmentin for sinus infection - continue saline nasal spray, cough medicine as discussed, and zofran if needed for nausea. Return to the clinic or go to the nearest emergency room if any of your symptoms worsen or new symptoms occur. Sinusitis Sinusitis is redness, soreness, and swelling (inflammation) of the paranasal sinuses. Paranasal sinuses are air pockets within the bones of your face (beneath the eyes, the middle of the forehead, or above the eyes). In healthy paranasal sinuses, mucus is able to drain out, and air is able to circulate through them by way of your nose. However, when your paranasal sinuses are inflamed, mucus and air can become trapped. This can allow bacteria and other germs to grow and cause infection. Sinusitis can develop quickly and last only a short time (acute) or continue over a long period (chronic). Sinusitis that lasts for more than 12 weeks is considered chronic.  CAUSES  Causes of sinusitis include:  Allergies.  Structural abnormalities, such as displacement of the cartilage that separates your nostrils (deviated septum), which can decrease the air flow through your nose and sinuses and affect sinus drainage.  Functional abnormalities, such as when the small hairs (cilia) that line your sinuses and help remove mucus do not work properly or are not present. SYMPTOMS  Symptoms of acute and chronic sinusitis are the same. The primary symptoms are pain and pressure around the affected sinuses. Other symptoms include:  Upper toothache.  Earache.  Headache.  Bad breath.  Decreased sense of smell and taste.  A cough, which worsens when you are lying flat.  Fatigue.  Fever.  Thick drainage from your nose, which often is green and may contain pus (purulent).  Swelling and warmth over the affected sinuses. DIAGNOSIS  Your caregiver will perform a physical exam. During the exam, your caregiver may:  Look in your nose for signs of abnormal growths  in your nostrils (nasal polyps).  Tap over the affected sinus to check for signs of infection.  View the inside of your sinuses (endoscopy) with a special imaging device with a light attached (endoscope), which is inserted into your sinuses. If your caregiver suspects that you have chronic sinusitis, one or more of the following tests may be recommended:  Allergy tests.  Nasal culture A sample of mucus is taken from your nose and sent to a lab and screened for bacteria.  Nasal cytology A sample of mucus is taken from your nose and examined by your caregiver to determine if your sinusitis is related to an allergy. TREATMENT  Most cases of acute sinusitis are related to a viral infection and will resolve on their own within 10 days. Sometimes medicines are prescribed to help relieve symptoms (pain medicine, decongestants, nasal steroid sprays, or saline sprays).  However, for sinusitis related to a bacterial infection, your caregiver will prescribe antibiotic medicines. These are medicines that will help kill the bacteria causing the infection.  Rarely, sinusitis is caused by a fungal infection. In theses cases, your caregiver will prescribe antifungal medicine. For some cases of chronic sinusitis, surgery is needed. Generally, these are cases in which sinusitis recurs more than 3 times per year, despite other treatments. HOME CARE INSTRUCTIONS   Drink plenty of water. Water helps thin the mucus so your sinuses can drain more easily.  Use a humidifier.  Inhale steam 3 to 4 times a day (for example, sit in the bathroom with the shower running).  Apply a warm, moist washcloth to your face 3 to  4 times a day, or as directed by your caregiver.  Use saline nasal sprays to help moisten and clean your sinuses.  Take over-the-counter or prescription medicines for pain, discomfort, or fever only as directed by your caregiver. SEEK IMMEDIATE MEDICAL CARE IF:  You have increasing pain or severe  headaches.  You have nausea, vomiting, or drowsiness.  You have swelling around your face.  You have vision problems.  You have a stiff neck.  You have difficulty breathing. MAKE SURE YOU:   Understand these instructions.  Will watch your condition.  Will get help right away if you are not doing well or get worse. Document Released: 03/28/2005 Document Revised: 06/20/2011 Document Reviewed: 04/12/2011 [facility] Patient Information 2013 [place], [place].

## 2012-05-11 NOTE — Progress Notes (Signed)
Subjective:    Patient ID: Amber Gibbs, female    DOB: May 31, 1965, 47 y.o.   MRN: [id number]  HPI Amber Gibbs is a 47 y.o. female See 05/06/12 ov.  Seen for cough, fever, tired, body aches, fever up to 103. Likely post influenza bronchitis vs faint pneumonia. Reassuring CBC.  Started Zpak, tessalon tid prn, proair only if wheezing - and hycodan if needed at night. Out of work for 2 days. CXR report: Findings: Normal lung volumes. Normal cardiac size and mediastinal contours. Visualized tracheal air column is within normal limits. No pneumothorax, pulmonary edema, pleural effusion or confluent pulmonary opacity. Minor scoliosis. No acute osseous abnormality identified. IMPRESSION: Negative, no acute cardiopulmonary abnormality.  Since ov 5 days ago - no improvement. Not worse in chest - just constant cough - spitting up green stuff. L sided face pain pressure and pain into ear - past few days, worse today. Has finished Zpak.   Nausea with drainage, less appetite - unable to eat solid food past 2 weeks due to nausea.  Unable to purchase inhaler due to cost - about 70 dollars, but has not had wheezing.  Has taken all of cough syrup - using about 3 times per night.  No other providers of cough medicine recently. Still off and on low grade fevers - 101 this morning.  Taking advil throughout day, tessalon during the day.  Takes astelin and flonase for allergies. Saline ns - 3-4 times per day.    Review of Systems  Constitutional: Positive for fever and chills.  HENT: Positive for congestion and sinus pressure.   Respiratory: Positive for cough.   Gastrointestinal: Positive for nausea.       Objective:   Physical Exam  Vitals reviewed. Constitutional: She is oriented to person, place, and time. She appears well-developed and well-nourished. No distress.  HENT:  Head: Normocephalic and atraumatic.  Right Ear: Hearing, tympanic membrane, external ear and ear canal normal.  Left Ear:  Hearing, tympanic membrane, external ear and ear canal normal.  Nose: Left sinus exhibits maxillary sinus tenderness.  Mouth/Throat: Oropharynx is clear and moist. No oropharyngeal exudate.  Eyes: Conjunctivae normal and EOM are normal. Pupils are equal, round, and reactive to light.  Cardiovascular: Normal rate, regular rhythm, normal heart sounds and intact distal pulses.   No murmur heard. Pulmonary/Chest: Effort normal and breath sounds normal. No respiratory distress. She has no wheezes. She has no rhonchi.  Neurological: She is alert and oriented to person, place, and time.  Skin: Skin is warm and dry. No rash noted.  Psychiatric: She has a normal mood and affect. Her behavior is normal.   Results for orders placed in visit on 05/11/12  POCT CBC      Component Value Range   WBC 9.1  4.6 - 10.2 K/uL   Lymph, poc 3.4  0.6 - 3.4   POC LYMPH PERCENT 37.7  10 - 50 %L   MID (cbc) 0.6  0 - 0.9   POC MID % 6.3  0 - 12 %M   POC Granulocyte 5.1  2 - 6.9   Granulocyte percent 56.0  37 - 80 %G   RBC 4.88  4.04 - 5.48 M/uL   Hemoglobin 14.4  12.2 - 16.2 g/dL   HCT, POC 78.2  95.6 - 47.9 %   MCV 92.7  80 - 97 fL   MCH, POC 29.5  27 - 31.2 pg   MCHC 31.9  31.8 - 35.4 g/dL  RDW, POC 13.6     Platelet Count, POC 275  142 - 424 K/uL   MPV 9.4  0 - 99.8 fL        Assessment & Plan:  Amber Gibbs is a 47 y.o. female 1. Fever  POCT CBC  2. Sinusitis  amoxicillin-clavulanate (AUGMENTIN) 875-125 MG per tablet  3. Cough  HYDROcodone-homatropine (HYCODAN) 5-1.5 MG/5ML syrup  4. Nausea  ondansetron (ZOFRAN-ODT) 8 MG disintegrating tablet  5. Sinusitis, acute, maxillary  ondansetron (ZOFRAN-ODT) 8 MG disintegrating tablet   Now sx's more consistent with L maxillary sinusitis. Reported hx of fevers, but CBC reassuring and afebrile in office.  Will add augmentin as above.  Can continue hycodan qhs prn -- correct frequency discussed. zofran for nausea - likely d/t PND. Cont sx care and rtc  precautions discussed.   Patient Instructions  Can start Augmentin for sinus infection - continue saline nasal spray, cough medicine as discussed, and zofran if needed for nausea. Return to the clinic or go to the nearest emergency room if any of your symptoms worsen or new symptoms occur. Sinusitis Sinusitis is redness, soreness, and swelling (inflammation) of the paranasal sinuses. Paranasal sinuses are air pockets within the bones of your face (beneath the eyes, the middle of the forehead, or above the eyes). In healthy paranasal sinuses, mucus is able to drain out, and air is able to circulate through them by way of your nose. However, when your paranasal sinuses are inflamed, mucus and air can become trapped. This can allow bacteria and other germs to grow and cause infection. Sinusitis can develop quickly and last only a short time (acute) or continue over a long period (chronic). Sinusitis that lasts for more than 12 weeks is considered chronic.  CAUSES  Causes of sinusitis include:  Allergies.  Structural abnormalities, such as displacement of the cartilage that separates your nostrils (deviated septum), which can decrease the air flow through your nose and sinuses and affect sinus drainage.  Functional abnormalities, such as when the small hairs (cilia) that line your sinuses and help remove mucus do not work properly or are not present. SYMPTOMS  Symptoms of acute and chronic sinusitis are the same. The primary symptoms are pain and pressure around the affected sinuses. Other symptoms include:  Upper toothache.  Earache.  Headache.  Bad breath.  Decreased sense of smell and taste.  A cough, which worsens when you are lying flat.  Fatigue.  Fever.  Thick drainage from your nose, which often is green and may contain pus (purulent).  Swelling and warmth over the affected sinuses. DIAGNOSIS  Your caregiver will perform a physical exam. During the exam, your caregiver  may:  Look in your nose for signs of abnormal growths in your nostrils (nasal polyps).  Tap over the affected sinus to check for signs of infection.  View the inside of your sinuses (endoscopy) with a special imaging device with a light attached (endoscope), which is inserted into your sinuses. If your caregiver suspects that you have chronic sinusitis, one or more of the following tests may be recommended:  Allergy tests.  Nasal culture A sample of mucus is taken from your nose and sent to a lab and screened for bacteria.  Nasal cytology A sample of mucus is taken from your nose and examined by your caregiver to determine if your sinusitis is related to an allergy. TREATMENT  Most cases of acute sinusitis are related to a viral infection and will resolve on their  own within 10 days. Sometimes medicines are prescribed to help relieve symptoms (pain medicine, decongestants, nasal steroid sprays, or saline sprays).  However, for sinusitis related to a bacterial infection, your caregiver will prescribe antibiotic medicines. These are medicines that will help kill the bacteria causing the infection.  Rarely, sinusitis is caused by a fungal infection. In theses cases, your caregiver will prescribe antifungal medicine. For some cases of chronic sinusitis, surgery is needed. Generally, these are cases in which sinusitis recurs more than 3 times per year, despite other treatments. HOME CARE INSTRUCTIONS   Drink plenty of water. Water helps thin the mucus so your sinuses can drain more easily.  Use a humidifier.  Inhale steam 3 to 4 times a day (for example, sit in the bathroom with the shower running).  Apply a warm, moist washcloth to your face 3 to 4 times a day, or as directed by your caregiver.  Use saline nasal sprays to help moisten and clean your sinuses.  Take over-the-counter or prescription medicines for pain, discomfort, or fever only as directed by your caregiver. SEEK IMMEDIATE  MEDICAL CARE IF:  You have increasing pain or severe headaches.  You have nausea, vomiting, or drowsiness.  You have swelling around your face.  You have vision problems.  You have a stiff neck.  You have difficulty breathing. MAKE SURE YOU:   Understand these instructions.  Will watch your condition.  Will get help right away if you are not doing well or get worse. Document Released: 03/28/2005 Document Revised: 06/20/2011 Document Reviewed: 04/12/2011 [facility] Patient Information 2013 [place], [place].

## 2012-05-26 ENCOUNTER — Other Ambulatory Visit: Payer: Self-pay

## 2012-07-25 ENCOUNTER — Encounter: Payer: Self-pay | Admitting: Family Medicine

## 2012-07-25 NOTE — Telephone Encounter (Signed)
Please advise on Ambien Rx. pended

## 2012-07-26 MED ORDER — ZOLPIDEM TARTRATE 5 MG PO TABS: 5.0000 mg | ORAL_TABLET | Freq: Every evening | ORAL | Status: DC | PRN

## 2012-07-26 NOTE — Telephone Encounter (Signed)
Please call pt --- new guidelines recommend limiting Ambien to 5mg  for all females and for all patients over age 47. I have changed her dose to 5mg  nightly to see how she does on lower dose. We can discuss further at upcoming appointment, but I would like her to try lower dose (5mg ).  Thanks, [employer]

## 2012-07-26 NOTE — Telephone Encounter (Signed)
Advised pt on cell VM that Rx was called in and advised about new guidelines and Dr Michaelle Copas instru's.

## 2012-08-01 ENCOUNTER — Encounter: Payer: Self-pay | Admitting: Family Medicine

## 2012-08-01 ENCOUNTER — Ambulatory Visit (INDEPENDENT_AMBULATORY_CARE_PROVIDER_SITE_OTHER): Payer: 59 | Admitting: Family Medicine

## 2012-08-01 VITALS — BP 116/72 | HR 77 | Temp 98.5°F | Resp 16 | Ht 68.75 in | Wt 145.4 lb

## 2012-08-01 DIAGNOSIS — G4700 Insomnia, unspecified: Secondary | ICD-10-CM

## 2012-08-01 DIAGNOSIS — F411 Generalized anxiety disorder: Secondary | ICD-10-CM

## 2012-08-01 DIAGNOSIS — R51 Headache: Secondary | ICD-10-CM

## 2012-08-01 DIAGNOSIS — E039 Hypothyroidism, unspecified: Secondary | ICD-10-CM

## 2012-08-01 LAB — T4, FREE: Free T4: 1.25 ng/dL (ref 0.80–1.80)

## 2012-08-01 LAB — BASIC METABOLIC PANEL
BUN: 6 mg/dL (ref 6–23)
Creat: 0.58 mg/dL (ref 0.50–1.10)

## 2012-08-01 MED ORDER — ONDANSETRON 8 MG PO TBDP: 8.0000 mg | ORAL_TABLET | Freq: Three times a day (TID) | ORAL | Status: DC | PRN

## 2012-08-01 MED ORDER — SUMATRIPTAN SUCCINATE 100 MG PO TABS: 100.0000 mg | ORAL_TABLET | ORAL | Status: DC | PRN

## 2012-08-01 MED ORDER — TRAZODONE HCL 50 MG PO TABS: 25.0000 mg | ORAL_TABLET | Freq: Every evening | ORAL | Status: DC | PRN

## 2012-08-01 MED ORDER — ALPRAZOLAM 1 MG PO TABS: 1.0000 mg | ORAL_TABLET | Freq: Three times a day (TID) | ORAL | Status: DC | PRN

## 2012-08-01 MED ORDER — PROMETHAZINE HCL 25 MG PO TABS: 25.0000 mg | ORAL_TABLET | Freq: Three times a day (TID) | ORAL | Status: DC | PRN
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
## 2012-08-01 NOTE — Patient Instructions (Addendum)
Migraine Headache A migraine headache is an intense, throbbing pain on one or both sides of your head. A migraine can last for 30 minutes to several hours. CAUSES  The exact cause of a migraine headache is not always known. However, a migraine may be caused when nerves in the brain become irritated and release chemicals that cause inflammation. This causes pain. SYMPTOMS  Pain on one or both sides of your head.  Pulsating or throbbing pain.  Severe pain that prevents daily activities.  Pain that is aggravated by any physical activity.  Nausea, vomiting, or both.  Dizziness.  Pain with exposure to bright lights, loud noises, or activity.  General sensitivity to bright lights, loud noises, or smells. Before you get a migraine, you may get warning signs that a migraine is coming (aura). An aura may include:  Seeing flashing lights.  Seeing bright spots, halos, or zig-zag lines.  Having tunnel vision or blurred vision.  Having feelings of numbness or tingling.  Having trouble talking.  Having muscle weakness. MIGRAINE TRIGGERS  Alcohol.  Smoking.  Stress.  Menstruation.  Aged cheeses.  Foods or drinks that contain nitrates, glutamate, aspartame, or tyramine.  Lack of sleep.  Chocolate.  Caffeine.  Hunger.  Physical exertion.  Fatigue.  Medicines used to treat chest pain (nitroglycerine), birth control pills, estrogen, and some blood pressure medicines. DIAGNOSIS  A migraine headache is often diagnosed based on:  Symptoms.  Physical examination.  A CT scan or MRI of your head. TREATMENT Medicines may be given for pain and nausea. Medicines can also be given to help prevent recurrent migraines.  HOME CARE INSTRUCTIONS  Only take over-the-counter or prescription medicines for pain or discomfort as directed by your caregiver. The use of long-term narcotics is not recommended.  Lie down in a dark, quiet room when you have a migraine.  Keep a journal  to find out what may trigger your migraine headaches. For example, write down:  What you eat and drink.  How much sleep you get.  Any change to your diet or medicines.  Limit alcohol consumption.  Quit smoking if you smoke.  Get 7 to 9 hours of sleep, or as recommended by your caregiver.  Limit stress.  Keep lights dim if bright lights bother you and make your migraines worse. SEEK IMMEDIATE MEDICAL CARE IF:   Your migraine becomes severe.  You have a fever.  You have a stiff neck.  You have vision loss.  You have muscular weakness or loss of muscle control.  You start losing your balance or have trouble walking.  You feel faint or pass out.  You have severe symptoms that are different from your first symptoms. MAKE SURE YOU:   Understand these instructions.  Will watch your condition.  Will get help right away if you are not doing well or get worse. Document Released: 03/28/2005 Document Revised: 06/20/2011 Document Reviewed: 03/18/2011 Exit[employer] Patient Information 2013 [employer].  

## 2012-08-03 ENCOUNTER — Telehealth: Payer: Self-pay

## 2012-08-03 ENCOUNTER — Encounter: Payer: Self-pay | Admitting: Family Medicine

## 2012-08-03 DIAGNOSIS — R51 Headache: Secondary | ICD-10-CM

## 2012-08-03 NOTE — Telephone Encounter (Signed)
See below info -  [id number]

## 2012-08-03 NOTE — Telephone Encounter (Signed)
Recommend non-contrast MRI brain instead of CT head with contrast.  Approval [id number] through 09/17/12.  HA awakening from sleep.  Please call pt ---- insurance company prefers MRI brain non-contrast instead of CT head.  This is a longer study; recommend taking her scheduled Xanax + another 1/2 Xanax 1mg  before MRI. Someone will need to drive her to and from MRI due to additional Xanax dose.

## 2012-08-03 NOTE — Telephone Encounter (Signed)
Peer-to-peer is needed for CT head/brain w/contrast, case number [id number]. Needs to be done by 08/05/12.

## 2012-08-07 ENCOUNTER — Other Ambulatory Visit: Payer: Self-pay | Admitting: Family Medicine

## 2012-08-07 NOTE — Telephone Encounter (Signed)
Dr Katrinka Blazing, do you want to RF this? It looks like pt has run out a little early.

## 2012-08-08 ENCOUNTER — Telehealth: Payer: Self-pay

## 2012-08-08 NOTE — Telephone Encounter (Signed)
Patient would like a call the medication took almost one hour to begin to work.  She is having migraine headaches and has missed two days of work.   [phone]

## 2012-08-08 NOTE — Telephone Encounter (Signed)
Pt's husband Elijah Birk is calling back to talk to Amy he is at work he said to call him back at [phone] and ask to speak to him

## 2012-08-08 NOTE — Telephone Encounter (Signed)
MRI sch for Friday, see message from patient, I can give her work note, can you advise on anything else for her?

## 2012-08-08 NOTE — Telephone Encounter (Signed)
Below noted.  I see that Imitrex took one hour to work; did the headache completely resolve within one hour?  If not, did she repeat the medication two hours after the first dose?

## 2012-08-08 NOTE — Telephone Encounter (Signed)
Called him, see previous message. Have advised him also to bring her back in.

## 2012-08-08 NOTE — Telephone Encounter (Signed)
Thanks, called her left message for her to call me back.

## 2012-08-08 NOTE — Telephone Encounter (Signed)
Her husband called back on her behalf. He advised she took one, did not completely resolve the headache, she did not repeat the dose. She only got quantity of 4 she has already taken 3 of them.

## 2012-08-09 ENCOUNTER — Emergency Department (HOSPITAL_COMMUNITY): Payer: 59

## 2012-08-09 ENCOUNTER — Encounter (HOSPITAL_COMMUNITY): Payer: Self-pay

## 2012-08-09 ENCOUNTER — Ambulatory Visit (INDEPENDENT_AMBULATORY_CARE_PROVIDER_SITE_OTHER): Payer: 59 | Admitting: Family Medicine

## 2012-08-09 ENCOUNTER — Emergency Department (HOSPITAL_COMMUNITY)
Admission: EM | Admit: 2012-08-09 | Discharge: 2012-08-09 | Disposition: A | Discharge status: Home / Self Care | Payer: 59 | Attending: Emergency Medicine | Admitting: Emergency Medicine

## 2012-08-09 VITALS — BP 117/78 | HR 71 | Temp 96.0°F | Resp 16

## 2012-08-09 DIAGNOSIS — Z8719 Personal history of other diseases of the digestive system: Secondary | ICD-10-CM | POA: Insufficient documentation

## 2012-08-09 DIAGNOSIS — R42 Dizziness and giddiness: Secondary | ICD-10-CM

## 2012-08-09 DIAGNOSIS — F3289 Other specified depressive episodes: Secondary | ICD-10-CM | POA: Insufficient documentation

## 2012-08-09 DIAGNOSIS — IMO0002 Reserved for concepts with insufficient information to code with codable children: Secondary | ICD-10-CM | POA: Insufficient documentation

## 2012-08-09 DIAGNOSIS — Z79899 Other long term (current) drug therapy: Secondary | ICD-10-CM | POA: Insufficient documentation

## 2012-08-09 DIAGNOSIS — R4789 Other speech disturbances: Secondary | ICD-10-CM | POA: Insufficient documentation

## 2012-08-09 DIAGNOSIS — E860 Dehydration: Secondary | ICD-10-CM

## 2012-08-09 DIAGNOSIS — R51 Headache: Secondary | ICD-10-CM

## 2012-08-09 DIAGNOSIS — Z8742 Personal history of other diseases of the female genital tract: Secondary | ICD-10-CM | POA: Insufficient documentation

## 2012-08-09 DIAGNOSIS — F411 Generalized anxiety disorder: Secondary | ICD-10-CM | POA: Insufficient documentation

## 2012-08-09 DIAGNOSIS — R209 Unspecified disturbances of skin sensation: Secondary | ICD-10-CM

## 2012-08-09 DIAGNOSIS — G4700 Insomnia, unspecified: Secondary | ICD-10-CM | POA: Insufficient documentation

## 2012-08-09 DIAGNOSIS — F329 Major depressive disorder, single episode, unspecified: Secondary | ICD-10-CM | POA: Insufficient documentation

## 2012-08-09 DIAGNOSIS — R200 Anesthesia of skin: Secondary | ICD-10-CM

## 2012-08-09 DIAGNOSIS — K219 Gastro-esophageal reflux disease without esophagitis: Secondary | ICD-10-CM | POA: Insufficient documentation

## 2012-08-09 DIAGNOSIS — R4781 Slurred speech: Secondary | ICD-10-CM

## 2012-08-09 DIAGNOSIS — Z87891 Personal history of nicotine dependence: Secondary | ICD-10-CM | POA: Insufficient documentation

## 2012-08-09 DIAGNOSIS — E039 Hypothyroidism, unspecified: Secondary | ICD-10-CM | POA: Insufficient documentation

## 2012-08-09 DIAGNOSIS — Z872 Personal history of diseases of the skin and subcutaneous tissue: Secondary | ICD-10-CM | POA: Insufficient documentation

## 2012-08-09 DIAGNOSIS — R112 Nausea with vomiting, unspecified: Secondary | ICD-10-CM | POA: Insufficient documentation

## 2012-08-09 LAB — POCT CBC
Granulocyte percent: 66.5 %G (ref 37–80)
HCT, POC: 42.7 % (ref 37.7–47.9)
Hemoglobin: 13.4 g/dL (ref 12.2–16.2)
Lymph, poc: 2.6 (ref 0.6–3.4)
POC Granulocyte: 6.5 (ref 2–6.9)

## 2012-08-09 LAB — POCT I-STAT, CHEM 8
BUN: <3 mg/dL — ABNORMAL LOW (ref 6–23)
Chloride: 104 mEq/L (ref 96–112)
HCT: 39 % (ref 36.0–46.0)
Sodium: 140 mEq/L (ref 135–145)

## 2012-08-09 LAB — CBC WITH DIFFERENTIAL/PLATELET
Basophils Absolute: 0 10*3/uL (ref 0.0–0.1)
Lymphocytes Relative: 31 % (ref 12–46)
Neutro Abs: 5.5 10*3/uL (ref 1.7–7.7)
Neutrophils Relative %: 60 % (ref 43–77)
Platelets: 249 10*3/uL (ref 150–400)
RDW: 13.4 % (ref 11.5–15.5)
WBC: 9.2 10*3/uL (ref 4.0–10.5)

## 2012-08-09 LAB — GLUCOSE, POCT (MANUAL RESULT ENTRY): POC Glucose: 112 mg/dl — AB (ref 70–99)

## 2012-08-09 MED ORDER — OXYCODONE-ACETAMINOPHEN 5-325 MG PO TABS
1.0000 | ORAL_TABLET | Freq: Once | ORAL | Status: AC
  Administered 2012-08-09: 1 via ORAL
  Filled 2012-08-09: qty 1

## 2012-08-09 MED ORDER — METOCLOPRAMIDE HCL 5 MG/ML IJ SOLN
10.0000 mg | Freq: Once | INTRAMUSCULAR | Status: AC
  Administered 2012-08-09: 10 mg via INTRAVENOUS
  Filled 2012-08-09: qty 2

## 2012-08-09 NOTE — ED Provider Notes (Addendum)
History     CSN: [id number]  Arrival date & time 08/09/12  1802   First MD Initiated Contact with Patient 08/09/12 1821      Chief Complaint  Patient presents with  . Headache  . Numbness    (Consider location/radiation/quality/duration/timing/severity/associated sxs/prior treatment) Patient is a 47 y.o. female presenting with headaches.  Headache  Patient has been suffering "migraine headaches" for the past several months. Has had approximately 2-3 migraine headaches per week for the past several weeks. Headaches are diffuse throbbing accompanied by nausea and vomiting she denies blurred vision she admits to slurred speech and feeling off balance gradual in onset 10:30 AM today while at work. An outpatient MRI was scheduled by her PCP for tomorrow. She's been treating herself with Imitrex, without adequate relief. No other complaint. Nothing makes symptoms better or worse. Past Medical History  Diagnosis Date  . Allergy   . Anxiety     with panic attacks  . GERD (gastroesophageal reflux disease)   . Depression   . Thyroid disease     Hypothyroidism  . Benign neoplasm of skin, site unspecified   . Tobacco use disorder   . Dysfunctional alcohol use   . Insomnia, unspecified   . Dyspepsia and other specified disorders of function of stomach   . Breast lump     Right    Past Surgical History  Procedure Laterality Date  . Abdominal hysterectomy  2008    Right ovary removed Left ovary intact    Family History  Problem Relation Age of Onset  . Arthritis Mother     rheumatoid  . Mental illness Mother   . Cancer Father     lung  . Cancer Maternal Grandmother   . Cancer Maternal Grandfather   . Cancer Paternal Grandfather     History  Substance Use Topics  . Smoking status: Former Smoker -- 1.00 packs/day for 25 years    Types: Cigarettes    Quit date: 08/22/2005  . Smokeless tobacco: Not on file  . Alcohol Use: Yes     Comment: socially    OB History   Grav  Para Term Preterm Abortions TAB SAB Ect Mult Living                  Review of Systems  Constitutional: Negative.   Respiratory: Negative.   Cardiovascular: Negative.   Gastrointestinal: Negative.   Musculoskeletal: Negative.   Skin: Negative.   Neurological: Positive for speech difficulty and headaches.       Difficulty with balance  Psychiatric/Behavioral: Negative.   All other systems reviewed and are negative.    Allergies  Sulfa antibiotics and Codeine  Home Medications   Current Outpatient Rx  Name  Route  Sig  Dispense  Refill  . albuterol (PROVENTIL HFA;VENTOLIN HFA) 108 (90 BASE) MCG/ACT inhaler   Inhalation   Inhale 2 puffs into the lungs every 6 (six) hours as needed for wheezing.   1 Inhaler   0   . ALPRAZolam (XANAX) 1 MG tablet   Oral   Take 1 mg by mouth 3 (three) times daily as needed for anxiety.         Marland Kitchen azelastine (ASTELIN) 137 MCG/SPRAY nasal spray   Nasal   Place 1 spray into the nose 2 (two) times daily. Use in each nostril as directed   30 mL   2   . cetirizine (ZYRTEC) 10 MG tablet   Oral   Take 10 mg by mouth  daily.          . fluticasone (FLONASE) 50 MCG/ACT nasal spray   Nasal   Place 2 sprays into the nose daily.   16 g   6   . ibuprofen (ADVIL,MOTRIN) 200 MG tablet   Oral   Take 200 mg by mouth every 6 (six) hours as needed for pain.         Marland Kitchen levothyroxine (SYNTHROID, LEVOTHROID) 88 MCG tablet   Oral   Take 1 tablet (88 mcg total) by mouth daily.   30 tablet   11   . omeprazole (PRILOSEC) 20 MG capsule   Oral   Take 20 mg by mouth daily. OTC         . ondansetron (ZOFRAN-ODT) 8 MG disintegrating tablet   Oral   Take 1 tablet (8 mg total) by mouth every 8 (eight) hours as needed for nausea.   30 tablet   0   . promethazine (PHENERGAN) 25 MG tablet      take 1 tablet by mouth by mouth if needed for nausea   30 tablet   0   . SUMAtriptan (IMITREX) 100 MG tablet   Oral   Take 1 tablet (100 mg total) by  mouth every 2 (two) hours as needed for migraine. Do not exceed 2 tablets in 24 hour period.   9 tablet   5   . traZODone (DESYREL) 50 MG tablet   Oral   Take 0.5-1 tablets (25-50 mg total) by mouth at bedtime as needed for sleep.   30 tablet   3   . vitamin E 200 UNIT capsule   Oral   Take 200 Units by mouth daily.         Marland Kitchen zolpidem (AMBIEN) 5 MG tablet   Oral   Take 1 tablet (5 mg total) by mouth at bedtime as needed for sleep.   30 tablet   0     BP 113/74  Pulse 63  Temp(Src) 98.1 F (36.7 C) (Oral)  Resp 18  SpO2 98%  Physical Exam  Nursing note and vitals reviewed. Constitutional: She is oriented to person, place, and time. She appears well-developed and well-nourished.  HENT:  Head: Normocephalic and atraumatic.  Eyes: Conjunctivae are normal. Pupils are equal, round, and reactive to light.  Neck: Neck supple. No tracheal deviation present. No thyromegaly present.  Cardiovascular: Normal rate and regular rhythm.   No murmur heard. Pulmonary/Chest: Effort normal and breath sounds normal.  Abdominal: Soft. Bowel sounds are normal. She exhibits no distension. There is no tenderness.  Musculoskeletal: Normal range of motion. She exhibits no edema and no tenderness.  Neurological: She is alert and oriented to person, place, and time. She has normal reflexes. Coordination normal.  DTRs symmetric bilaterally knee jerk ankle jerk and biceps was ordered bilaterally. Speech mildly slurred. Heel-to-shin normal pronator drift normal Romberg normal gait normal  Skin: Skin is warm and dry. No rash noted.  Psychiatric: She has a normal mood and affect.    ED Course  Procedures (including critical care time)  Labs Reviewed  CBC WITH DIFFERENTIAL   No results found.   No diagnosis found.  MRI ordered the patient which she declined. Stating she's have an open MRI tomorrow 8:30 PM patient was improved after treatment with Reglan IV. Speech is no longer slurred. She  requests additional pain medicine Percocet ordered. 8:55 PM she patient is ready to go MDM  Symptoms most likely secondary to migraine rather than  stroke. Slurred speech is and difficulty with balance gradual in onset. She is encouraged to get her MRI tomorrow, which will be an open MRI Diagnosis headache        Doug Sou, MD 08/09/12 1610  Doug Sou, MD 08/09/12 2057
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
## 2012-08-09 NOTE — Telephone Encounter (Signed)
Thanks, I have called him and left message advised him to call me back.

## 2012-08-09 NOTE — Progress Notes (Signed)
[address]   [place], [place]  [address]   [id number]  Subjective:    Patient ID: Amber Gibbs, female    DOB: 1965-12-05, 47 y.o.   MRN: [id number]  HPI This 47 y.o. female presents for evaluation of headache, slurred speech, dizziness, L sided facial numbness.  Onset of slurred speech and facial numbness this morning around 10:00am.  Dizziness started two days ago.  Onset of headaches two months ago.  Evaluated one week ago due to new onset migraine like headaches; scheduled for MRI tomorrow evening 5:30pm.  Headaches have increased in intensity in past week. +associated blurred vision, dizziness, nausea with vomiting.  Onset of slurred speech and L sided facial numbness today at 10:00 am or seven hours ago.  No focal weakness.  +severe headache today.  No medications for headache or nausea today.  Has been taking Imitrex this week with mild improvement of headache.  Lots of stressors in past month which patient thought was etiology to headaches.  No family history of migraines, aneurysm.     Review of Systems  Constitutional: Negative for fever, chills, diaphoresis and fatigue.  Respiratory: Negative for cough and shortness of breath.   Cardiovascular: Negative for chest pain, palpitations and leg swelling.  Neurological: Positive for dizziness, speech difficulty, numbness and headaches. Negative for tremors, seizures, syncope, facial asymmetry, weakness and light-headedness.  Psychiatric/Behavioral: Positive for sleep disturbance and dysphoric mood. Negative for confusion. The patient is nervous/anxious.         Past Medical History  Diagnosis Date  . Allergy   . Anxiety     with panic attacks  . GERD (gastroesophageal reflux disease)   . Depression   . Thyroid disease     Hypothyroidism  . Benign neoplasm of skin, site unspecified   . Tobacco use disorder   . Dysfunctional alcohol use   . Insomnia, unspecified   . Dyspepsia and other specified disorders of function of  stomach   . Breast lump     Right    Past Surgical History  Procedure Laterality Date  . Abdominal hysterectomy  2008    Right ovary removed Left ovary intact    Prior to Admission medications   Medication Sig Start Date End Date Taking? Authorizing Provider  albuterol (PROVENTIL HFA;VENTOLIN HFA) 108 (90 BASE) MCG/ACT inhaler Inhale 2 puffs into the lungs every 6 (six) hours as needed for wheezing. 05/06/12  Yes Shade Flood, MD  ALPRAZolam Prudy Feeler) 1 MG tablet Take 1 tablet (1 mg total) by mouth 3 (three) times daily as needed. 08/01/12  Yes Ethelda Chick, MD  azelastine (ASTELIN) 137 MCG/SPRAY nasal spray Place 1 spray into the nose 2 (two) times daily. Use in each nostril as directed 02/03/12  Yes Heather M Marte, PA-C  cetirizine (ZYRTEC) 10 MG tablet Take 10 mg by mouth daily as needed.   Yes Historical Provider, MD  fluticasone (FLONASE) 50 MCG/ACT nasal spray Place 2 sprays into the nose daily. 08/23/11  Yes Lamar Laundry, MD  levothyroxine (SYNTHROID, LEVOTHROID) 88 MCG tablet Take 1 tablet (88 mcg total) by mouth daily. 01/18/12  Yes Ethelda Chick, MD  omeprazole (PRILOSEC) 20 MG capsule Take 20 mg by mouth daily. OTC   Yes Historical Provider, MD  ondansetron (ZOFRAN-ODT) 8 MG disintegrating tablet Take 1 tablet (8 mg total) by mouth every 8 (eight) hours as needed for nausea. 08/01/12  Yes Ethelda Chick, MD  promethazine (PHENERGAN) 25 MG tablet take 1  tablet by mouth by mouth if needed for nausea 08/07/12  Yes Ethelda Chick, MD  SUMAtriptan (IMITREX) 100 MG tablet Take 1 tablet (100 mg total) by mouth every 2 (two) hours as needed for migraine. Do not exceed 2 tablets in 24 hour period. 08/01/12  Yes Ethelda Chick, MD  traZODone (DESYREL) 50 MG tablet Take 0.5-1 tablets (25-50 mg total) by mouth at bedtime as needed for sleep. 08/01/12  Yes Ethelda Chick, MD  zolpidem (AMBIEN) 5 MG tablet Take 1 tablet (5 mg total) by mouth at bedtime as needed for sleep. 07/25/12  Yes Ethelda Chick, MD    Allergies  Allergen Reactions  . Sulfa Antibiotics Nausea And Vomiting  . Codeine Hives    History   Social History  . Marital Status: Married    Spouse Name: N/A    Number of Children: N/A  . Years of Education: N/A   Occupational History  . Passenger transport manager    [employer]   Social History Main Topics  . Smoking status: Former Smoker -- 1.00 packs/day for 25 years    Types: Cigarettes    Quit date: 08/22/2005  . Smokeless tobacco: Not on file  . Alcohol Use: Yes     Comment: socially  . Drug Use: No  . Sexually Active: Yes     Comment: Hysterectomy   Other Topics Concern  . Not on file   Social History Narrative   Marital status: married      Children:  2 adopted sons (18, 68)      Lives with: husband, 2 sons      Employment: works Fish farm manager      Tobacco: former smoker; quit 2007      Alcohol: socially      Drugs: none      Exercise: daily    Family History  Problem Relation Age of Onset  . Arthritis Mother     rheumatoid  . Mental illness Mother   . Cancer Father     lung  . Cancer Maternal Grandmother   . Cancer Maternal Grandfather   . Cancer Paternal Grandfather     Objective:   Physical Exam  Nursing note and vitals reviewed. Constitutional: She is oriented to person, place, and time. She appears well-developed and well-nourished. No distress.  HENT:  Head: Normocephalic and atraumatic.  Mouth/Throat: Oropharynx is clear and moist.  Eyes: Conjunctivae and EOM are normal. Pupils are equal, round, and reactive to light.  Neck: Normal range of motion. Neck supple. No thyromegaly present.  Cardiovascular: Normal rate, regular rhythm and normal heart sounds.   Pulmonary/Chest: Effort normal and breath sounds normal. She has no wheezes. She has no rales.  Lymphadenopathy:    She has no cervical adenopathy.  Neurological: She is alert and oriented to person, place, and time. She has normal strength.  A sensory deficit is present. No cranial nerve deficit. She exhibits normal muscle tone. Coordination normal.  Slurred speech; +decreased sensation L facial region.  Skin: No rash noted. She is not diaphoretic.  Psychiatric: She has a normal mood and affect. Her behavior is normal.    Results for orders placed in visit on 08/09/12  POCT CBC      Result Value Range   WBC 9.8  4.6 - 10.2 K/uL   Lymph, poc 2.6  0.6 - 3.4   POC LYMPH PERCENT 26.7  10 - 50 %L   MID (cbc) 0.7  0 - 0.9   POC MID % 6.8  0 - 12 %M   POC Granulocyte 6.5  2 - 6.9   Granulocyte percent 66.5  37 - 80 %G   RBC 4.48  4.04 - 5.48 M/uL   Hemoglobin 13.4  12.2 - 16.2 g/dL   HCT, POC 16.1  09.6 - 47.9 %   MCV 95.3  80 - 97 fL   MCH, POC 29.9  27 - 31.2 pg   MCHC 31.4 (*) 31.8 - 35.4 g/dL   RDW, POC 04.5     Platelet Count, POC 295  142 - 424 K/uL   MPV 9.0  0 - 99.8 fL  GLUCOSE, POCT (MANUAL RESULT ENTRY)      Result Value Range   POC Glucose 112 (*) 70 - 99 mg/dl         Assessment & Plan:  Dizzy - Plan: POCT CBC, POCT glucose (manual entry)  Headache  Slurred speech  Left facial numbness   1. Headache:  New onset in past two months.  Associated with photophobia, nausea, vomiting.  Now with associated slurred speech, L facial numbness.  To ED for r/u acute CVA, SAH.  Transported by EMS to ED. 2.  Slurred speech: New. Onset in past 6-7 hours.  Associated with headache, facial numbness. 3.  L facial numbness:  New.  4.  Dizziness: New.

## 2012-08-09 NOTE — ED Notes (Signed)
Pt woke up at 5 am this morning with a headache and slurred speech.  Pt was last normal at 7pm last night when she went to sleep.  Pt is suppose to have an MRI tomorrow.  Pt has been having a headache for the past 2 months.  Pt does not have hx of these headaches prior to 2 months ago.  Pt reporting nausea and blurred vision to her left eye.  No facial droop noted but pt does have some slurred speech currently present.  No weakness noted, abnormal gaits.  CBG 112. 20 LAC PTA at [facility] urgent care. VSS.

## 2012-08-09 NOTE — Telephone Encounter (Signed)
Spoke to him yesterday, he is asking if her problem could be vertigo?

## 2012-08-09 NOTE — Telephone Encounter (Signed)
Bernadette's husband Elijah Birk is calling to speak to Amy Call back number is [phone]

## 2012-08-09 NOTE — Telephone Encounter (Signed)
Called husband back and he stated that pt is here now being seen by Dr Katrinka Blazing.

## 2012-08-09 NOTE — Telephone Encounter (Signed)
Yes, recommend evaluation in office tonight if a lot worse than visit on 08/01/12.

## 2012-08-09 NOTE — ED Notes (Signed)
Pt very unsteady on feet during assessment.  Pt stated she did not want an MRI due to claustrophobia.  Pt declined any medication to help relax if one could be ordered.  Stated she would just wait for her open MRI tomorrow.

## 2012-08-09 NOTE — Telephone Encounter (Signed)
Call --- patient is not suffering with vertigo.  Vertigo is a spinning sensation that is usually triggered by turning head to the side or rolling over in bed.  Vertigo is not associated with headaches but is associated with nausea and vomiting.

## 2012-08-10 ENCOUNTER — Ambulatory Visit
Admission: RE | Admit: 2012-08-10 | Discharge: 2012-08-10 | Disposition: A | Discharge status: Home / Self Care | Payer: 59 | Source: Ambulatory Visit | Attending: Family Medicine | Admitting: Family Medicine

## 2012-08-10 DIAGNOSIS — R51 Headache: Secondary | ICD-10-CM

## 2012-08-12 ENCOUNTER — Other Ambulatory Visit: Payer: Self-pay | Admitting: Family Medicine

## 2012-08-12 ENCOUNTER — Encounter: Payer: Self-pay | Admitting: Family Medicine

## 2012-08-12 DIAGNOSIS — R200 Anesthesia of skin: Secondary | ICD-10-CM

## 2012-08-12 DIAGNOSIS — R4781 Slurred speech: Secondary | ICD-10-CM

## 2012-08-12 DIAGNOSIS — R51 Headache: Secondary | ICD-10-CM

## 2012-08-14 ENCOUNTER — Encounter: Payer: Self-pay | Admitting: Family Medicine

## 2012-08-15 NOTE — Progress Notes (Signed)
Spoke with pt, she prefers to schedule appt with Dr. Katrinka Blazing after her neuro appt.

## 2012-08-17 MED ORDER — TRAMADOL HCL 50 MG PO TABS: ORAL_TABLET | ORAL | Status: DC

## 2012-08-17 NOTE — Telephone Encounter (Signed)
Spoke with patient:  1.  Did not have a good week.  Lying down currently. Daily headache this week.  Constant headache.  Cannot take Imitrex at work because makes pt a little loopy.  Taking 6 Advil at work during the day.  Three major/severe headaches.  Headache constant ;always a nagging pain; as the day progresses, headache worsens.  Had a couple of spells where wave of nausea with shaking, hot.  Eating or drinking helps.  Living on nausea medicine and headache medication.  Decreased appetite.  Taking Imitrex x 3 days; taking Advil at work.     2.  Appointment with neurology Tuesday morning at 8:00 am at [facility].  3.  Taking Amitriptyline 1/2 to 1 at bedtime.   4.  MRI was tolerable.  5.  Nausea worsens with worsening headaches.  6.  Constant headache since visit.   A/P:  Headaches: worsening; patient agreeable to Ultram rx.  Sent to pharmacy.

## 2012-08-21 ENCOUNTER — Encounter: Payer: Self-pay | Admitting: Neurology

## 2012-08-21 ENCOUNTER — Ambulatory Visit (INDEPENDENT_AMBULATORY_CARE_PROVIDER_SITE_OTHER): Payer: 59 | Admitting: Neurology

## 2012-08-21 VITALS — BP 111/74 | HR 79 | Temp 98.0°F | Ht 70.0 in | Wt 146.0 lb

## 2012-08-21 DIAGNOSIS — G43719 Chronic migraine without aura, intractable, without status migrainosus: Secondary | ICD-10-CM

## 2012-08-21 HISTORY — DX: Chronic migraine without aura, intractable, without status migrainosus: G43.719

## 2012-08-21 MED ORDER — AMITRIPTYLINE HCL 25 MG PO TABS: 50.0000 mg | ORAL_TABLET | Freq: Every day | ORAL | Status: DC

## 2012-08-21 NOTE — Patient Instructions (Addendum)
I think overall you are doing fairly well but I do want to suggest a few things today:  Remember to drink plenty of fluid, eat healthy meals and do not skip any meals. Try to eat protein with a every meal and eat a healthy snack such as fruit or nuts in between meals. Try to keep a regular sleep-wake schedule and try to exercise daily, particularly in the form of walking, 20-30 minutes a day, if you can.   Please remember, common headache triggers are: sleep deprivation, dehydration, overheating, stress, hypoglycemia or skipping meals, excessive pain medications or excessive alcohol use. Some people have food triggers such as aged cheese, orange juice or chocolate, especially dark chocolate. Try to avoid these headache triggers as much possible. It may be helpful to keep a headache diary to figure out what makes your headaches worse or brings them on. Some people report headache onset after exercise but studies have shown that regular exercise may actually prevent headaches from coming on. If you have exercise-induced headaches, please make sure that you drink plenty of fluid before and after exercising and that you don't over do it.  As far as your medications are concerned, I would like to suggest: stop the trazodone, start elavil/amitriptyline: Take half a pill daily at bedtime for one week, then one pill daily at bedtime for one week, then one half pills daily at bedtime for one week, then 2 pills daily at bedtime thereafter. Limit your use of Imitrex, do not take daily. Cautious use of tramadol during the day.  As far as diagnostic testing: MRA brain, echocardiogram.  I would like to see you back in 3 months, sooner if we need to. Please call us with any interim questions, concerns, problems, updates or refill requests.  Please also call us for any test results so we can go over those with you on the phone. Brett Canales is my clinical assistant and will answer any of your questions and relay your messages  to me and also relay most of my messages to you.  Our phone number is [phone]. We also have an after hours call service for urgent matters and there is a physician on-call for urgent questions. For any emergencies you know to call 911 or go to the nearest emergency room.

## 2012-08-21 NOTE — Progress Notes (Signed)
Subjective:    Patient ID: Amber Gibbs is a 47 y.o. female.  HPI  Dear Dr. Katrinka Blazing,   I saw your patient, Amber Gibbs, upon your kind request in my neurologic clinic today for initial consultation of her headaches. The patient is accompanied by her husband, Amber Gibbs, today. As you know, Amber Gibbs is a very pleasant 47 year old right-handed woman with an underlying medical history of allergic rhinitis, reflux disease, thyroid disease, chronic insomnia, depression and anxiety who started having headaches about 2-3 months ago. Her headache was described as migrainous and associated with blurry vision, lightheadedness, nausea and vomiting, as well as photo and sonophobia. On 08/09/2012 she noticed sudden onset of slurring of speech and left facial numbness associated with a severe headache. She woke up with theses symptoms and noticed staggering gait, but went to work, but then went to [facility] and from there was referred to the ER. The neurological Sx lasted for 8-10 hours. She has no family history of migraine headaches or aneurysm. Her HAs started in February and got worse in April. She now has almost daily headaches. She does endorse a number of stressors. She had a head CT on 08/09/2012 which was reported as negative without contrast. Her medications are Astelin, Zyrtec, Xanax, Imitrex, albuterol, Flonase, Advil, Synthroid, Prilosec, Zofran, Phenergan, trazodone, vitamin E, and Ambien. She had a brain MRI on 08/10/2012, which was reviewed and showed: minimal WM changes.  She has never experienced any strokelike symptoms such as sudden onset of one-sided weakness, facial droop, monocular vision loss or diplopia or similar symptoms as described above otherwise. She has a mentally handicapped son age 64, who has been in and out of group homes and crisis centres. She has been on Xanax 1 mg tid for a long time. She was started on Trazodone 25 mg and reduced in her Ambien dose to 5 mg. She takes phenergan at night.  She has been taking Imitrex daily for the past week. She also has taken 2 at times. During the day she may take one or 2 pills of tramadol which recently was started. She does not take the Imitrex at work because it makes her sedated and she feels she cannot function. In the emergency room she was treated with IV Reglan and also given Percocet. She does not report a clear aura. Currently she has a mild to moderate headache and reports tingling in her left face.  Her Past Medical History Is Significant For: Past Medical History  Diagnosis Date  . Allergy   . Anxiety     with panic attacks  . GERD (gastroesophageal reflux disease)   . Depression   . Thyroid disease     Hypothyroidism  . Benign neoplasm of skin, site unspecified   . Tobacco use disorder   . Dysfunctional alcohol use   . Insomnia, unspecified   . Dyspepsia and other specified disorders of function of stomach   . Breast lump     Right  . Chronic migraine without aura, with intractable migraine, so stated, without mention of status migrainosus 08/21/2012    Her Past Surgical History Is Significant For: Past Surgical History  Procedure Laterality Date  . Abdominal hysterectomy  2008    Right ovary removed Left ovary intact    Her Family History Is Significant For: Family History  Problem Relation Age of Onset  . Arthritis Mother     rheumatoid  . Mental illness Mother   . Cancer Father     lung  .  Cancer Maternal Grandmother   . Cancer Maternal Grandfather   . Cancer Paternal Grandfather     Her Social History Is Significant For: History   Social History  . Marital Status: Married    Spouse Name: Amber Gibbs    Number of Children: 2  . Years of Education: BA   Occupational History  . Engineer, agricultural [employer]    [employer]  . [employer]   Social History Main Topics  . Smoking status: Current Every Day Smoker -- 0.35 packs/day for 25 years    Types: Cigarettes    Last  Attempt to Quit: 08/22/2005  . Smokeless tobacco: Never Used  . Alcohol Use: Yes     Comment: socially  . Drug Use: No  . Sexually Active: Yes     Comment: Hysterectomy   Other Topics Concern  . None   Social History Narrative   Marital status: married      Children:  2 adopted sons (18, 39)      Lives with: husband, 2 sons      Employment: works Fish farm manager      Tobacco: former smoker; quit 2007      Alcohol: socially      Drugs: none      Exercise: daily   Caffeine Use: 1 cup of coffee in the a.m.; 8oz of tea at lunch    Her Allergies Are:  Allergies  Allergen Reactions  . Sulfa Antibiotics Nausea And Vomiting  . Codeine Hives  :   Her Current Medications Are:  Outpatient Encounter Prescriptions as of 08/21/2012  Medication Sig Dispense Refill  . ALPRAZolam (XANAX) 1 MG tablet Take 1 mg by mouth 3 (three) times daily as needed for anxiety.      Marland Kitchen azelastine (ASTELIN) 137 MCG/SPRAY nasal spray Place 1 spray into the nose 2 (two) times daily. Use in each nostril as directed  30 mL  2  . cetirizine (ZYRTEC) 10 MG tablet Take 10 mg by mouth daily.       . fluticasone (FLONASE) 50 MCG/ACT nasal spray Place 2 sprays into the nose daily.  16 g  6  . levothyroxine (SYNTHROID, LEVOTHROID) 88 MCG tablet Take 1 tablet (88 mcg total) by mouth daily.  30 tablet  11  . omeprazole (PRILOSEC) 20 MG capsule Take 20 mg by mouth daily. OTC      . ondansetron (ZOFRAN-ODT) 8 MG disintegrating tablet Take 1 tablet (8 mg total) by mouth every 8 (eight) hours as needed for nausea.  30 tablet  0  . promethazine (PHENERGAN) 25 MG tablet take 1 tablet by mouth by mouth if needed for nausea  30 tablet  0  . SUMAtriptan (IMITREX) 100 MG tablet Take 1 tablet (100 mg total) by mouth every 2 (two) hours as needed for migraine. Do not exceed 2 tablets in 24 hour period.  9 tablet  5  . traMADol (ULTRAM) 50 MG tablet 1-2 tablets three times daily PRN pain  45 tablet  0  . traZODone (DESYREL) 50 MG tablet  Take 0.5-1 tablets (25-50 mg total) by mouth at bedtime as needed for sleep.  30 tablet  3  . vitamin E 200 UNIT capsule Take 200 Units by mouth daily.      Marland Kitchen zolpidem (AMBIEN) 5 MG tablet Take 1 tablet (5 mg total) by mouth at bedtime as needed for sleep.  30 tablet  0  . amitriptyline (ELAVIL) 25 MG tablet Take 2 tablets (  50 mg total) by mouth at bedtime. Follow titration instruction.  60 tablet  3  . [DISCONTINUED] albuterol (PROVENTIL HFA;VENTOLIN HFA) 108 (90 BASE) MCG/ACT inhaler Inhale 2 puffs into the lungs every 6 (six) hours as needed for wheezing.  1 Inhaler  0  . [DISCONTINUED] ibuprofen (ADVIL,MOTRIN) 200 MG tablet Take 200 mg by mouth every 6 (six) hours as needed for pain.       No facility-administered encounter medications on file as of 08/21/2012.  :  Review of Systems  Constitutional: Positive for fatigue.  Eyes: Positive for pain and visual disturbance.       Blurred vision  Respiratory: Positive for shortness of breath.   Gastrointestinal: Positive for constipation.  Endocrine: Positive for heat intolerance.       Increased thirst  Allergic/Immunologic: Positive for environmental allergies.  Neurological: Positive for numbness and headaches.       Dizziness, sleepiiness  Psychiatric/Behavioral:       Anxiety, decreased energy, change in appetite, disinterest in activities    Objective:  Neurologic Exam  Physical Exam Physical Examination:   Filed Vitals:   08/21/12 0820  BP: 111/74  Pulse: 79  Temp: 98 F (36.7 C)    General Examination: The patient is a very pleasant 47 y.o. female in no acute distress. She appears well-developed and well-nourished and well groomed.   HEENT: Normocephalic, atraumatic, pupils are equal, round and reactive to light and accommodation. Funduscopic exam is normal with sharp disc margins noted. Extraocular tracking is good without limitation to gaze excursion or nystagmus noted. Normal smooth pursuit is noted. Hearing is  grossly intact. Tympanic membranes are clear bilaterally. Face is symmetric with normal facial animation and normal facial sensation, but she reports tingling in her left face and perhaps a slight decrease in pinprick sensation in the mid face on the left. She reports a swelling in her right forehead but there is no swelling noted. Speech is clear with no dysarthria noted. There is no hypophonia. There is no lip, neck/head, jaw or voice tremor. Neck is supple with full range of passive and active motion. There are no carotid bruits on auscultation. Oropharynx exam reveals: good dental hygiene and moderate airway crowding, due to tonsillar size are 2+, narrow airway entry, and an elongated uvula. Mallampati is class II. Tongue protrudes centrally and palate elevates symmetrically. Neck size is 13.5 inches.   Chest: Clear to auscultation without wheezing, rhonchi or crackles noted.  Heart: S1+S2+0, regular and normal without murmurs, rubs or gallops noted.   Abdomen: Soft, non-tender and non-distended with normal bowel sounds appreciated on auscultation.  Extremities: There is no pitting edema in the distal lower extremities bilaterally.   Skin: Warm and dry without trophic changes noted. There are no varicose veins.  Musculoskeletal: exam reveals no obvious joint deformities, tenderness or joint swelling or erythema.   Neurologically:  Mental status: The patient is awake, alert and oriented in all 4 spheres. Her memory, attention, language and knowledge are appropriate. There is no aphasia, agnosia, apraxia or anomia. Speech is clear with normal prosody and enunciation. Thought process is linear. Mood is congruent and affect is normal.  Cranial nerves are as described above under HEENT exam. In addition, shoulder shrug is normal with equal shoulder height noted. Motor exam: Normal bulk, strength and tone is noted. There is no drift, tremor or rebound. Romberg is negative. Reflexes are 2+ throughout.  Fine motor skills are intact with normal finger taps, normal hand movements, normal rapid alternating  patting, normal foot taps and normal foot agility.  Cerebellar testing shows no dysmetria or intention tremor on finger to nose testing. Heel to shin is unremarkable bilaterally. There is no truncal or gait ataxia.  Sensory exam is intact to light touch, pinprick, vibration, temperature sense in the upper and lower extremities.  Gait, station and balance are unremarkable. No veering to one side is noted. No leaning to one side is noted. Posture is age-appropriate and stance is narrow based. No problems turning are noted. She turns en bloc. Tandem walk is unremarkable. Her balance is preserved, she is able to walk in high heeled shoes with heels that are several inches in height.           Assessment and Plan:   Assessment and Plan:  In summary, Amber Gibbs is a very pleasant 47 y.o.-year old female with a history of headaches which started in February of this year. She does not have a prior history of migraine headaches. Her history and physical exam are consistent with intractable migraines without aura. Her physical exam is for the most part nonfocal with the exception of subjective more than objective decrease in facial sensation in the left mid face with paresthesias reported. I reassured her about her physical exam and her recent test results including the head CT and the MRI brain. Nevertheless, would like to proceed with a brain MR a as well as a echocardiogram to look for a PFO.  I had a long chat with the patient and her husband about my findings and the diagnosis of Migraines, the prognosis and treatment options. We talked about medical treatments and non-pharmacological approaches. We talked about maintaining a healthy lifestyle in general. I encouraged the patient to eat healthy, exercise daily and keep well hydrated, to keep a scheduled bedtime and wake time routine, to not skip any meals  and eat healthy snacks in between meals and to have protein with every meal. I talked her about common headache triggers including sleep deprivation, stress, dehydration, blood sugar fluctuations.   As far as further diagnostic testing is concerned, I suggested the following today: TTE and brain MRA.  As far as medications are concerned, I recommended the following at this time: I would like for her to limit her Imitrex use. I would like for her to get started on amitriptyline like you had discussed with her before. I would like for her to come off of trazodone in order to start her on amitriptyline. I did advise her about the potential rare side effect of taking Imitrex and amitriptyline together, namely serotonin syndrome. In this is why I would like for her to come off of trazodone at this time. Amitriptyline will be started at 25 mg strength half a pill at night only and every week she is encouraged to increase it by half a pill to up to 2 pills at night. During the day she is advised to take Tylenol or anti-inflammatory medicine over-the-counter with caution, or her tramadol.  I answered all their  questions today and the patient and her husband were in agreement with the above outlined plan. I would like to see the patient back in 3 months, sooner if the need arises and encouraged  them  to call with any interim questions, concerns, problems or updates and refill requests and test results.   Thank you very much for allowing me to participate in the care of this nice patient. If I can be of any further assistance  to you please do not hesitate to call me at [phone].  Sincerely,   Huston Foley, MD, PhD

## 2012-08-25 ENCOUNTER — Other Ambulatory Visit: Payer: Self-pay | Admitting: Family Medicine

## 2012-08-27 ENCOUNTER — Encounter: Payer: Self-pay | Admitting: Family Medicine

## 2012-09-06 ENCOUNTER — Encounter: Payer: Self-pay | Admitting: Family Medicine

## 2012-09-06 ENCOUNTER — Ambulatory Visit
Admission: RE | Admit: 2012-09-06 | Discharge: 2012-09-06 | Disposition: A | Discharge status: Home / Self Care | Payer: 59 | Source: Ambulatory Visit | Attending: Neurology | Admitting: Neurology

## 2012-09-06 DIAGNOSIS — G43719 Chronic migraine without aura, intractable, without status migrainosus: Secondary | ICD-10-CM

## 2012-09-06 DIAGNOSIS — R51 Headache: Secondary | ICD-10-CM

## 2012-09-10 NOTE — Progress Notes (Signed)
Quick Note:  Please call and advise the patient that the recent scan we did was within normal limits. We did a brain MRA to look at the intracranial blood vessels and it was reported as normal. No further action is required on this test at this time. Please remind patient to keep any upcoming appointments or tests and to call us with any interim questions, concerns, problems or updates. Thanks,  Huston Foley, MD, PhD   ______

## 2012-09-11 ENCOUNTER — Telehealth: Payer: Self-pay | Admitting: Neurology

## 2012-09-11 NOTE — Progress Notes (Signed)
Quick Note:  I called and left a message for the patient that her MRA results were normal and if she has any questions to callback to the office. ______

## 2012-09-28 ENCOUNTER — Telehealth: Payer: Self-pay

## 2012-09-28 DIAGNOSIS — E039 Hypothyroidism, unspecified: Secondary | ICD-10-CM

## 2012-09-28 MED ORDER — LEVOTHYROXINE SODIUM 88 MCG PO TABS: 88.0000 ug | ORAL_TABLET | Freq: Every day | ORAL | Status: DC

## 2012-09-28 NOTE — Telephone Encounter (Signed)
In Oct 2013 pt was given #30 with 11 RFs so should have several refills left

## 2012-09-28 NOTE — Telephone Encounter (Signed)
They did not have the refills, the remaining ones are sent in for her.

## 2012-09-28 NOTE — Telephone Encounter (Signed)
Patient is out of her synthroid would like a refill if possible she has called her pharmacy please call her at [phone]

## 2012-10-10 ENCOUNTER — Ambulatory Visit (INDEPENDENT_AMBULATORY_CARE_PROVIDER_SITE_OTHER): Payer: 59 | Admitting: Family Medicine

## 2012-10-10 ENCOUNTER — Encounter: Payer: Self-pay | Admitting: Family Medicine

## 2012-10-10 VITALS — BP 109/72 | HR 83 | Temp 99.7°F | Resp 16 | Ht 69.0 in | Wt 147.4 lb

## 2012-10-10 DIAGNOSIS — F419 Anxiety disorder, unspecified: Secondary | ICD-10-CM

## 2012-10-10 DIAGNOSIS — G4700 Insomnia, unspecified: Secondary | ICD-10-CM

## 2012-10-10 DIAGNOSIS — J0100 Acute maxillary sinusitis, unspecified: Secondary | ICD-10-CM

## 2012-10-10 DIAGNOSIS — G43719 Chronic migraine without aura, intractable, without status migrainosus: Secondary | ICD-10-CM

## 2012-10-10 MED ORDER — LEVOFLOXACIN 750 MG PO TABS: 750.0000 mg | ORAL_TABLET | Freq: Every day | ORAL | Status: DC

## 2012-10-10 NOTE — Progress Notes (Signed)
[address]   [place], [place]  [address]   [phone]  Subjective:    Patient ID: Amber Gibbs, female    DOB: Mar 29, 1966, 47 y.o.   MRN: [id number]  HPI This 47 y.o. female presents for evaluation of two month follow-up:  1. Migraines intractable: s/p ED consultation after visit on 08/09/12.  Agreed with complex migraine.  S/p neurology consultation; ordered MRA brain; ordered 2D-echo.  Started Amitriptyline.  Stopped Trazodone, Imitrex, Tramadol.  Experience with neurology horrible.  Has never been called for echo.  Took two weeks to get MRA scheduled; took four days to get someone to call with results.  2D-echo ordered.  Started on Amitriptyline 1/2 qhs for one week; then increase to 2 tablets Amitriptyline qhs; now only taking one Amitriptyline at night.  Having some headaches; having a twinge of a headache; no long duration; occurs later in afternoon.  Can take Advil; if medium, can take Ultram.  No other rescue medications.  Advised to stop Trazodone.  Last severe headache several weeks ago.    2.  Anxiety: persistent but stable; son in group home.  Not sure he can ever return home.  Compliance with medication; good tolerance to medication; good symptom control.  3. Insomnia:  Advised to stop Trazodone by neurologist; switched to Amitriptyline.    4.  Sinus congestion: started three days ago; taking OTC; sinus pressure and pain.  Ears in cloud.  Compliance with nasal sprays, Allegra.  No fever; no sore throat.  Scratchy throat.  Mild cough.    Review of Systems  Constitutional: Negative for fever, chills, diaphoresis and fatigue.  HENT: Positive for congestion, rhinorrhea, sneezing, postnasal drip and sinus pressure. Negative for ear pain and sore throat.   Eyes: Negative for photophobia, pain, redness and visual disturbance.  Respiratory: Positive for cough. Negative for shortness of breath and stridor.   Gastrointestinal: Negative for nausea, vomiting and diarrhea.  Endocrine:  Negative for cold intolerance and heat intolerance.  Neurological: Positive for headaches. Negative for dizziness, tremors, seizures, syncope, facial asymmetry, speech difficulty, weakness, light-headedness and numbness.  Psychiatric/Behavioral: Positive for sleep disturbance. Negative for suicidal ideas, self-injury and dysphoric mood. The patient is nervous/anxious.    Past Medical History  Diagnosis Date  . Allergy   . Anxiety     with panic attacks  . GERD (gastroesophageal reflux disease)   . Depression   . Thyroid disease     Hypothyroidism  . Benign neoplasm of skin, site unspecified   . Tobacco use disorder   . Dysfunctional alcohol use   . Insomnia, unspecified   . Dyspepsia and other specified disorders of function of stomach   . Breast lump     Right  . Chronic migraine without aura, with intractable migraine, so stated, without mention of status migrainosus 08/21/2012   History   Social History  . Marital Status: Married    Spouse Name: Tom    Number of Children: 2  . Years of Education: BA   Occupational History  . Engineer, agricultural [employer]    [employer]  . [employer]   Social History Main Topics  . Smoking status: Current Every Day Smoker -- 0.35 packs/day for 25 years    Types: Cigarettes    Last Attempt to Quit: 08/22/2005  . Smokeless tobacco: Never Used  . Alcohol Use: Yes     Comment: socially  . Drug Use: No  . Sexually Active: Yes  Comment: Hysterectomy   Other Topics Concern  . Not on file   Social History Narrative   Marital status: married      Children:  2 adopted sons (18, 49)      Lives with: husband, 2 sons      Employment: works Fish farm manager      Tobacco: former smoker; quit 2007      Alcohol: socially      Drugs: none      Exercise: daily   Caffeine Use: 1 cup of coffee in the a.m.; 8oz of tea at lunch   Allergies  Allergen Reactions  . Sulfa Antibiotics Nausea And Vomiting  . Codeine  Hives   Current Outpatient Prescriptions on File Prior to Visit  Medication Sig Dispense Refill  . ALPRAZolam (XANAX) 1 MG tablet Take 1 mg by mouth 3 (three) times daily as needed for anxiety.      Marland Kitchen amitriptyline (ELAVIL) 25 MG tablet Take 2 tablets (50 mg total) by mouth at bedtime. Follow titration instruction.  60 tablet  3  . azelastine (ASTELIN) 137 MCG/SPRAY nasal spray Place 1 spray into the nose 2 (two) times daily. Use in each nostril as directed  30 mL  2  . cetirizine (ZYRTEC) 10 MG tablet Take 10 mg by mouth daily.       . fluticasone (FLONASE) 50 MCG/ACT nasal spray instill 2 sprays into each nostril once daily  16 g  6  . levothyroxine (SYNTHROID, LEVOTHROID) 88 MCG tablet Take 1 tablet (88 mcg total) by mouth daily.  30 tablet  3  . omeprazole (PRILOSEC) 20 MG capsule Take 20 mg by mouth daily. OTC      . ondansetron (ZOFRAN-ODT) 8 MG disintegrating tablet Take 1 tablet (8 mg total) by mouth every 8 (eight) hours as needed for nausea.  30 tablet  0  . promethazine (PHENERGAN) 25 MG tablet take 1 tablet by mouth by mouth if needed for nausea  30 tablet  0  . vitamin E 200 UNIT capsule Take 200 Units by mouth daily.      Marland Kitchen zolpidem (AMBIEN) 5 MG tablet take 1 tablet by mouth at bedtime if needed for sleep  30 tablet  5   No current facility-administered medications on file prior to visit.       Objective:   Physical Exam  Nursing note and vitals reviewed. Constitutional: She is oriented to person, place, and time. She appears well-developed and well-nourished. No distress.  HENT:  Head: Normocephalic and atraumatic.  Right Ear: External ear normal.  Left Ear: External ear normal.  Nose: Mucosal edema and rhinorrhea present. Right sinus exhibits maxillary sinus tenderness. Right sinus exhibits no frontal sinus tenderness. Left sinus exhibits maxillary sinus tenderness. Left sinus exhibits no frontal sinus tenderness.  Mouth/Throat: Oropharynx is clear and moist.  Eyes:  Conjunctivae are normal. Pupils are equal, round, and reactive to light.  Neck: Normal range of motion. Neck supple. No thyromegaly present.  Cardiovascular: Normal rate, regular rhythm and normal heart sounds.   Pulmonary/Chest: Effort normal and breath sounds normal. She has no wheezes. She has no rales.  Lymphadenopathy:    She has cervical adenopathy.  Neurological: She is alert and oriented to person, place, and time. No cranial nerve deficit. She exhibits normal muscle tone. Coordination normal.  Skin: She is not diaphoretic.  Psychiatric: She has a normal mood and affect. Her behavior is normal. Judgment and thought content normal.       Assessment &  Plan:  Chronic migraine without aura, with intractable migraine, so stated, without mention of status migrainosus - Plan: 2D Echocardiogram without contrast  Acute maxillary sinusitis - Plan: levofloxacin (LEVAQUIN) 750 MG tablet  Insomnia  1.  Migraine HA:  Improved; s/p neurology consultation; s/p MRI/MRA; awaiting referral for 2D-echo thus will re-order today.  Switched to Amitriptyline.  Much less frequent headaches; agreeable to manage migraines as long as remain stable; if acutely worsen, will need to follow-up with neurology. 2.  Acute maxillary sinusitis:  New. Rx for Levaquin provided; continue Allegra and nasal sprays. 3.  Insomnia: controlled with current regimen.  Continue Ambien 5mg  and Amitriptyline.  Meds ordered this encounter  Medications  . levofloxacin (LEVAQUIN) 750 MG tablet    Sig: Take 1 tablet (750 mg total) by mouth daily.    Dispense:  5 tablet    Refill:  0

## 2012-10-15 ENCOUNTER — Ambulatory Visit (HOSPITAL_COMMUNITY): Payer: 59

## 2012-10-16 ENCOUNTER — Encounter: Payer: Self-pay | Admitting: Family Medicine

## 2012-10-26 ENCOUNTER — Ambulatory Visit (HOSPITAL_COMMUNITY): Payer: 59

## 2012-11-14 DIAGNOSIS — G4700 Insomnia, unspecified: Secondary | ICD-10-CM | POA: Insufficient documentation

## 2012-11-14 DIAGNOSIS — F419 Anxiety disorder, unspecified: Secondary | ICD-10-CM | POA: Insufficient documentation

## 2012-11-22 ENCOUNTER — Ambulatory Visit: Payer: 59 | Admitting: Neurology

## 2013-01-07 ENCOUNTER — Telehealth: Payer: Self-pay

## 2013-01-07 ENCOUNTER — Ambulatory Visit (INDEPENDENT_AMBULATORY_CARE_PROVIDER_SITE_OTHER): Payer: 59 | Admitting: Family Medicine

## 2013-01-07 VITALS — BP 104/72 | HR 68 | Temp 98.7°F | Resp 18 | Ht 68.25 in | Wt 151.4 lb

## 2013-01-07 DIAGNOSIS — J029 Acute pharyngitis, unspecified: Secondary | ICD-10-CM

## 2013-01-07 DIAGNOSIS — J020 Streptococcal pharyngitis: Secondary | ICD-10-CM

## 2013-01-07 DIAGNOSIS — J019 Acute sinusitis, unspecified: Secondary | ICD-10-CM

## 2013-01-07 DIAGNOSIS — R51 Headache: Secondary | ICD-10-CM

## 2013-01-07 LAB — POCT RAPID STREP A (OFFICE): Rapid Strep A Screen: NEGATIVE

## 2013-01-07 MED ORDER — BENZONATATE 200 MG PO CAPS: 200.0000 mg | ORAL_CAPSULE | Freq: Two times a day (BID) | ORAL | Status: DC | PRN

## 2013-01-07 MED ORDER — HYDROCODONE-HOMATROPINE 5-1.5 MG/5ML PO SYRP: 5.0000 mL | ORAL_SOLUTION | Freq: Every evening | ORAL | Status: DC | PRN

## 2013-01-07 MED ORDER — ONDANSETRON 8 MG PO TBDP: 8.0000 mg | ORAL_TABLET | Freq: Three times a day (TID) | ORAL | Status: DC | PRN

## 2013-01-07 MED ORDER — AMOXICILLIN-POT CLAVULANATE 875-125 MG PO TABS: 1.0000 | ORAL_TABLET | Freq: Two times a day (BID) | ORAL | Status: DC

## 2013-01-07 NOTE — Patient Instructions (Signed)

## 2013-01-07 NOTE — Progress Notes (Signed)
[facility]:  Office Visit  Chief Complaint:  Chief Complaint  Patient presents with  . Headache    started friday  . Sore Throat  . Cough  . Otitis Media    left ear    HPI: Amber Gibbs is a 47 y.o. female who complains of  3 days worth of sinus congestion, HA, sorethroat, subjective fevers, left ear pain, no jaw pain. Dry cough. She has tried Advil without relief.  Nonsmoker, used to smoke but quit in 2007. Denies asthma, + allergies.  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   Past Medical History  Diagnosis Date  . Allergy   . Anxiety     with panic attacks  . GERD (gastroesophageal reflux disease)   . Depression   . Thyroid disease     Hypothyroidism  . Benign neoplasm of skin, site unspecified   . Tobacco use disorder   . Dysfunctional alcohol use   . Insomnia, unspecified   . Dyspepsia and other specified disorders of function of stomach   . Breast lump     Right  . Chronic migraine without aura, with intractable migraine, so stated, without mention of status migrainosus 08/21/2012   Past Surgical History  Procedure Laterality Date  . Abdominal hysterectomy  2008    Right ovary removed Left ovary intact   History   Social History  . Marital Status: Married    Spouse Name: Tom    Number of Children: 2  . Years of Education: BA   Occupational History  . Engineer, agricultural [employer]    [place]  . [employer]   Social History Main Topics  . Smoking status: Former Smoker -- 0.35 packs/day for 25 years    Types: Cigarettes    Quit date: 08/22/2005  . Smokeless tobacco: Never Used  . Alcohol Use: 1.0 oz/week    2 drink(s) per week     Comment: socially  . Drug Use: No  . Sexual Activity: Yes     Comment: Hysterectomy   Other Topics Concern  . None   Social History Narrative   Marital status: married      Children:  2 adopted sons  (18, 44)      Lives with: husband, 2 sons      Employment: works Fish farm manager      Tobacco: former smoker; quit 2007      Alcohol: socially      Drugs: none      Exercise: daily   Caffeine Use: 1 cup of coffee in the a.m.; 8oz of tea at lunch   Family History  Problem Relation Age of Onset  . Arthritis Mother     rheumatoid  . Mental illness Mother   . Cancer Father     lung  . Cancer Maternal Grandmother   . Cancer Maternal Grandfather   . Cancer Paternal Grandfather    Allergies  Allergen Reactions  . Sulfa Antibiotics Nausea And Vomiting  . Codeine Hives   Prior to Admission medications   Medication Sig Start Date End Date Taking? Authorizing Provider  ALPRAZolam Prudy Feeler) 1 MG tablet Take 1 mg by mouth 3 (three) times daily as needed for anxiety. 08/01/12  Yes Ethelda Chick, MD  amitriptyline (ELAVIL) 25 MG tablet Take 2 tablets (50 mg total) by mouth at bedtime. Follow titration instruction. 08/21/12  Yes Saima  Frances Furbish, MD  azelastine (ASTELIN) 137 MCG/SPRAY nasal spray Place 1 spray into the nose 2 (two) times daily. Use in each nostril as directed 02/03/12  Yes Heather M Marte, PA-C  cetirizine (ZYRTEC) 10 MG tablet Take 10 mg by mouth daily.    Yes Historical Provider, MD  fluticasone Aleda Grana) 50 MCG/ACT nasal spray instill 2 sprays into each nostril once daily 08/25/12  Yes Ryan M Dunn, PA-C  levothyroxine (SYNTHROID, LEVOTHROID) 88 MCG tablet Take 1 tablet (88 mcg total) by mouth daily. 09/28/12  Yes Ethelda Chick, MD  omeprazole (PRILOSEC) 20 MG capsule Take 20 mg by mouth daily. OTC   Yes Historical Provider, MD  promethazine (PHENERGAN) 25 MG tablet take 1 tablet by mouth by mouth if needed for nausea 08/07/12  Yes Ethelda Chick, MD  vitamin E 200 UNIT capsule Take 200 Units by mouth daily.   Yes Historical Provider, MD  zolpidem (AMBIEN) 5 MG tablet take 1 tablet by mouth at bedtime if needed for sleep 08/25/12  Yes Ethelda Chick, MD  levofloxacin (LEVAQUIN) 750 MG tablet  Take 1 tablet (750 mg total) by mouth daily. 10/10/12   Ethelda Chick, MD  ondansetron (ZOFRAN-ODT) 8 MG disintegrating tablet Take 1 tablet (8 mg total) by mouth every 8 (eight) hours as needed for nausea. 08/01/12   Ethelda Chick, MD     ROS: The patient denies night sweats, unintentional weight loss, chest pain, palpitations, wheezing, dyspnea on exertion, nausea, vomiting, abdominal pain, dysuria, hematuria, melena, numbness, weakness, or tingling.   All other systems have been reviewed and were otherwise negative with the exception of those mentioned in the HPI and as above.    PHYSICAL EXAM: Filed Vitals:   01/07/13 1256  BP: 104/72  Pulse: 68  Temp: 98.7 F (37.1 C)  Resp: 18   Filed Vitals:   01/07/13 1256  Height: 5' 8.25" (1.734 m)  Weight: 151 lb 6.4 oz (68.675 kg)   Body mass index is 22.84 kg/(m^2).  General: Alert, no acute distress HEENT:  Normocephalic, atraumatic, oropharynx patent. EOMI, PERRLA. TM nl, + sinus tenderness, no exudates Cardiovascular:  Regular rate and rhythm, no rubs murmurs or gallops.  No Carotid bruits, radial pulse intact. No pedal edema.  Respiratory: Clear to auscultation bilaterally.  No wheezes, rales, or rhonchi.  No cyanosis, no use of accessory musculature GI: No organomegaly, abdomen is soft and non-tender, positive bowel sounds.  No masses. Skin: No rashes. Neurologic: Facial musculature symmetric. Psychiatric: Patient is appropriate throughout our interaction. Lymphatic: No cervical lymphadenopathy Musculoskeletal: Gait intact.   LABS: Results for orders placed in visit on 01/07/13  POCT RAPID STREP A (OFFICE)      Result Value Range   Rapid Strep A Screen Negative  Negative     EKG/XRAY:   Primary read interpreted by Dr. Conley Rolls at [facility].   ASSESSMENT/PLAN: Encounter Diagnoses  Name Primary?  . sore throat Yes  . Acute pharyngitis   . Sinusitis, acute   . Headache(784.0)    Rx augmentin and hycodan Work note  given F/u prn Gross sideeffects, risk and benefits, and alternatives of medications d/w patient. Patient is aware that all medications have potential sideeffects and we are unable to predict every sideeffect or drug-drug interaction that may occur.  Dupree Givler PHUONG, DO 01/08/2013 11:31 AM

## 2013-01-07 NOTE — Telephone Encounter (Signed)
Call --- I will be happy to see patient tomorrow, 9/30 by appointment at 8:00am.  Does this time work for her?

## 2013-01-07 NOTE — Telephone Encounter (Signed)
Pt is requesting to see dr Katrinka Blazing today for a possible sinus infection or strep. I explained to the patient dr Katrinka Blazing is in appt center today, but has no openings, i also explained that illnesses, typically are not treated in the appt cent and suggested she be evaluated in the walk-in in center. Pt requested we still forward message to dr Katrinka Blazing to review.

## 2013-01-09 NOTE — Telephone Encounter (Signed)
Pt seen at 102 01/07/13 at 1pm.

## 2013-01-16 ENCOUNTER — Encounter: Payer: Self-pay | Admitting: Family Medicine

## 2013-01-16 ENCOUNTER — Ambulatory Visit (INDEPENDENT_AMBULATORY_CARE_PROVIDER_SITE_OTHER): Payer: 59 | Admitting: Family Medicine

## 2013-01-16 VITALS — BP 112/82 | HR 82 | Temp 99.0°F | Resp 16 | Ht 68.0 in | Wt 153.8 lb

## 2013-01-16 DIAGNOSIS — F419 Anxiety disorder, unspecified: Secondary | ICD-10-CM

## 2013-01-16 DIAGNOSIS — F411 Generalized anxiety disorder: Secondary | ICD-10-CM

## 2013-01-16 DIAGNOSIS — G43719 Chronic migraine without aura, intractable, without status migrainosus: Secondary | ICD-10-CM

## 2013-01-16 DIAGNOSIS — G4700 Insomnia, unspecified: Secondary | ICD-10-CM

## 2013-01-16 DIAGNOSIS — J0100 Acute maxillary sinusitis, unspecified: Secondary | ICD-10-CM

## 2013-01-16 MED ORDER — AMITRIPTYLINE HCL 25 MG PO TABS: 50.0000 mg | ORAL_TABLET | Freq: Every day | ORAL | Status: DC

## 2013-01-16 MED ORDER — LEVOFLOXACIN 750 MG PO TABS: 750.0000 mg | ORAL_TABLET | Freq: Every day | ORAL | Status: DC

## 2013-01-16 MED ORDER — ZOLPIDEM TARTRATE 5 MG PO TABS: 5.0000 mg | ORAL_TABLET | Freq: Every evening | ORAL | Status: DC | PRN

## 2013-01-16 MED ORDER — HYDROCOD POLST-CHLORPHEN POLST 10-8 MG/5ML PO LQCR: 5.0000 mL | Freq: Two times a day (BID) | ORAL | Status: DC | PRN

## 2013-01-16 MED ORDER — ALPRAZOLAM 1 MG PO TABS: 1.0000 mg | ORAL_TABLET | Freq: Three times a day (TID) | ORAL | Status: DC | PRN

## 2013-01-16 MED ORDER — PREDNISONE 20 MG PO TABS: ORAL_TABLET | ORAL | Status: DC

## 2013-01-16 NOTE — Progress Notes (Signed)
[address]   [place], [place]  [address]   [phone]  Subjective:    Patient ID: Amber Gibbs, female    DOB: 1965-04-12, 47 y.o.   MRN: [id number]  HPI This 47 y.o. female presents for three month follow-up:  1.  Sinus congestion: onset in past week; no fever/chills/sweats.  +ST; no ear pain; +HA; +sinus pressure; +rhinorrhea and nasal congestion and PND: scant cough; no n/v/d.  Compliance with Flonase, Astelin, oral antihistamine.  2.  Migraines: stable since last visit; no worsening; has seemed to ease off.  Compliance with Amitriptyline.  Desires to be followed by PCP if migraines remain stable.  No paresthesias, focal weakness, slurred speech.  3.  Anxiety: stable; home issues are slowly improving.     Review of Systems  Constitutional: Negative for fever, chills, diaphoresis and fatigue.  HENT: Positive for congestion, postnasal drip, rhinorrhea, sinus pressure and sore throat. Negative for ear pain.   Respiratory: Positive for cough. Negative for shortness of breath.   Cardiovascular: Negative for chest pain.  Endocrine: Negative for cold intolerance, heat intolerance, polydipsia, polyphagia and polyuria.  Neurological: Positive for headaches. Negative for dizziness, tremors, seizures, syncope, facial asymmetry, speech difficulty, weakness, light-headedness and numbness.  Psychiatric/Behavioral: Positive for sleep disturbance. Negative for suicidal ideas, self-injury and dysphoric mood. The patient is nervous/anxious.    Past Medical History  Diagnosis Date  . Allergy   . Anxiety     with panic attacks  . GERD (gastroesophageal reflux disease)   . Depression   . Thyroid disease     Hypothyroidism  . Benign neoplasm of skin, site unspecified   . Tobacco use disorder   . Dysfunctional alcohol use   . Insomnia, unspecified   . Dyspepsia and other specified disorders of function of stomach   . Breast lump     Right  . Chronic migraine without aura, with  intractable migraine, so stated, without mention of status migrainosus 08/21/2012   Past Surgical History  Procedure Laterality Date  . Abdominal hysterectomy  2008    Right ovary removed Left ovary intact   Allergies  Allergen Reactions  . Sulfa Antibiotics Nausea And Vomiting  . Codeine Hives   Current Outpatient Prescriptions on File Prior to Visit  Medication Sig Dispense Refill  . fluticasone (FLONASE) 50 MCG/ACT nasal spray instill 2 sprays into each nostril once daily  16 g  6  . vitamin E 200 UNIT capsule Take 200 Units by mouth daily.       No current facility-administered medications on file prior to visit.   History   Social History  . Marital Status: Married    Spouse Name: Tom    Number of Children: 2  . Years of Education: BA   Occupational History  . Engineer, agricultural [employer]    [place] [employer]  . [employer]   Social History Main Topics  . Smoking status: Former Smoker -- 0.35 packs/day for 25 years    Types: Cigarettes    Quit date: 08/22/2005  . Smokeless tobacco: Never Used  . Alcohol Use: 1.0 oz/week    2 drink(s) per week     Comment: socially  . Drug Use: No  . Sexual Activity: Yes     Comment: Hysterectomy   Other Topics Concern  . Not on file   Social History Narrative   Marital status: married      Children:  2 adopted sons (18, 36)  Lives with: husband, 2 sons      Employment: works Fish farm manager      Tobacco: former smoker; quit 2007      Alcohol: socially      Drugs: none      Exercise: daily   Caffeine Use: 1 cup of coffee in the a.m.; 8oz of tea at lunch   Family History  Problem Relation Age of Onset  . Arthritis Mother     rheumatoid  . Mental illness Mother   . Cancer Father     lung  . Cancer Maternal Grandmother   . Cancer Maternal Grandfather   . Cancer Paternal Grandfather        Objective:   Physical Exam  Nursing note and vitals reviewed. Constitutional: She is oriented to  person, place, and time. She appears well-developed and well-nourished. No distress.  HENT:  Head: Normocephalic and atraumatic.  Right Ear: External ear normal.  Left Ear: External ear normal.  Nose: Mucosal edema and rhinorrhea present. Right sinus exhibits maxillary sinus tenderness and frontal sinus tenderness. Left sinus exhibits maxillary sinus tenderness and frontal sinus tenderness.  Mouth/Throat: Mucous membranes are normal. Posterior oropharyngeal erythema present. No oropharyngeal exudate or posterior oropharyngeal edema.  Eyes: Conjunctivae are normal. Pupils are equal, round, and reactive to light.  Neck: Normal range of motion. Neck supple.  Cardiovascular: Normal rate, regular rhythm and normal heart sounds.  Exam reveals no gallop and no friction rub.   No murmur heard. Pulmonary/Chest: Effort normal and breath sounds normal. She has no wheezes. She has no rales.  Neurological: She is alert and oriented to person, place, and time. No cranial nerve deficit. She exhibits normal muscle tone. Coordination normal.  Skin: Skin is warm and dry. No rash noted. She is not diaphoretic.  Psychiatric: She has a normal mood and affect. Her behavior is normal.        Assessment & Plan:  Chronic migraine without aura, with intractable migraine, so stated, without mention of status migrainosus - Plan: amitriptyline (ELAVIL) 25 MG tablet  Acute maxillary sinusitis - Plan: DISCONTINUED: levofloxacin (LEVAQUIN) 750 MG tablet, DISCONTINUED: levofloxacin (LEVAQUIN) 750 MG tablet  Anxiety  Insomnia  1. Migraines: improving and stabilizing; continue Amitriptyline and Ultram as needed. 2.  Acute maxillary sinusitis:  New. Rx for Levaquin, Prednisone, Tussionex provided. 3.  Anxiety: stable; refill of Xanax provided; counseling provided. 4.  Insomnia: stable on Amitriptyline and Ambien.    Meds ordered this encounter  Medications  . ALPRAZolam (XANAX) 1 MG tablet    Sig: Take 1 tablet (1  mg total) by mouth 3 (three) times daily as needed for anxiety.    Dispense:  90 tablet    Refill:  5  . DISCONTD: zolpidem (AMBIEN) 5 MG tablet    Sig: Take 1 tablet (5 mg total) by mouth at bedtime as needed for sleep.    Dispense:  30 tablet    Refill:  5  . amitriptyline (ELAVIL) 25 MG tablet    Sig: Take 2 tablets (50 mg total) by mouth at bedtime. Follow titration instruction.    Dispense:  60 tablet    Refill:  5  . DISCONTD: levofloxacin (LEVAQUIN) 750 MG tablet    Sig: Take 1 tablet (750 mg total) by mouth daily.    Dispense:  5 tablet    Refill:  0  . DISCONTD: chlorpheniramine-HYDROcodone (TUSSIONEX) 10-8 MG/5ML LQCR    Sig: Take 5 mLs by mouth every 12 (twelve) hours as needed.  Dispense:  180 mL    Refill:  0  . DISCONTD: predniSONE (DELTASONE) 20 MG tablet    Sig: Two tablets daily x 5 days then one tablet daily x 5 days    Dispense:  15 tablet    Refill:  0  . DISCONTD: levofloxacin (LEVAQUIN) 750 MG tablet    Sig: Take 1 tablet (750 mg total) by mouth daily.    Dispense:  7 tablet    Refill:  0  . zolpidem (AMBIEN) 5 MG tablet    Sig: Take 1 tablet (5 mg total) by mouth at bedtime as needed for sleep.    Dispense:  30 tablet    Refill:  5   Nilda Simmer, M.D.  [facility] [address] [place], [place]  [address] [phone] phone [phone] fax

## 2013-01-25 ENCOUNTER — Other Ambulatory Visit: Payer: Self-pay | Admitting: Radiology

## 2013-01-25 ENCOUNTER — Encounter: Payer: Self-pay | Admitting: Family Medicine

## 2013-01-25 DIAGNOSIS — E039 Hypothyroidism, unspecified: Secondary | ICD-10-CM

## 2013-01-25 MED ORDER — LEVOTHYROXINE SODIUM 88 MCG PO TABS: 88.0000 ug | ORAL_TABLET | Freq: Every day | ORAL | Status: DC

## 2013-01-25 NOTE — Telephone Encounter (Signed)
rx encounter

## 2013-02-14 ENCOUNTER — Other Ambulatory Visit: Payer: Self-pay

## 2013-03-04 ENCOUNTER — Telehealth: Payer: Self-pay

## 2013-03-04 NOTE — Telephone Encounter (Signed)
PT NEEDS REFILL ON NASAL SPRAY AZELASTINE   [facility] [address]

## 2013-03-05 MED ORDER — AZELASTINE HCL 0.1 % NA SOLN: 1.0000 | Freq: Two times a day (BID) | NASAL | Status: DC

## 2013-03-05 NOTE — Telephone Encounter (Signed)
RF sent.

## 2013-05-08 ENCOUNTER — Encounter: Payer: Self-pay | Admitting: Family Medicine

## 2013-05-08 DIAGNOSIS — R51 Headache: Secondary | ICD-10-CM

## 2013-05-08 MED ORDER — ONDANSETRON 8 MG PO TBDP: 8.0000 mg | ORAL_TABLET | Freq: Three times a day (TID) | ORAL | Status: DC | PRN

## 2013-05-26 ENCOUNTER — Encounter: Payer: Self-pay | Admitting: Family Medicine

## 2013-05-26 DIAGNOSIS — E039 Hypothyroidism, unspecified: Secondary | ICD-10-CM

## 2013-05-28 MED ORDER — LEVOTHYROXINE SODIUM 88 MCG PO TABS: 88.0000 ug | ORAL_TABLET | Freq: Every day | ORAL | Status: DC

## 2013-05-28 MED ORDER — OMEPRAZOLE 20 MG PO CPDR: 20.0000 mg | DELAYED_RELEASE_CAPSULE | Freq: Every day | ORAL | Status: DC

## 2013-06-03 ENCOUNTER — Encounter: Payer: Self-pay | Admitting: Family Medicine

## 2013-06-04 MED ORDER — AMOXICILLIN-POT CLAVULANATE 875-125 MG PO TABS: 1.0000 | ORAL_TABLET | Freq: Two times a day (BID) | ORAL | Status: DC

## 2013-06-14 ENCOUNTER — Ambulatory Visit (INDEPENDENT_AMBULATORY_CARE_PROVIDER_SITE_OTHER): Payer: 59 | Admitting: Internal Medicine

## 2013-06-14 VITALS — BP 126/76 | HR 110 | Temp 98.6°F | Resp 17 | Ht 69.5 in | Wt 160.0 lb

## 2013-06-14 DIAGNOSIS — J209 Acute bronchitis, unspecified: Secondary | ICD-10-CM

## 2013-06-14 MED ORDER — HYDROCOD POLST-CHLORPHEN POLST 10-8 MG/5ML PO LQCR: 5.0000 mL | Freq: Two times a day (BID) | ORAL | Status: DC | PRN

## 2013-06-14 MED ORDER — AZITHROMYCIN 250 MG PO TABS: ORAL_TABLET | ORAL | Status: DC

## 2013-06-14 NOTE — Patient Instructions (Signed)

## 2013-06-14 NOTE — Progress Notes (Signed)
   Subjective:    Patient ID: Amber Gibbs, female    DOB: 01-Jun-1965, 48 y.o.   MRN: [id number]  HPI Pt presents with head congestion, cough, otalgia (left), sore throat. It is a deep "chest like" cough. She thinks it started Wednesday. She just finished 10 days of augmentin. She is a former smoker. No one else at home has been sick. The cough is non productive. She says she has had some chills but hasn't measured her fever. She has only been taking advil. She denies body aches. She did get a flu shot this year.  No sob,cp.Does have chills and is getting worse  Review of Systems Uses hydrocodone no problems    Objective:   Physical Exam  Vitals reviewed. Constitutional: She is oriented to person, place, and time. She appears well-developed and well-nourished. She is cooperative. She appears ill. No distress.  HENT:  Head: Normocephalic.  Right Ear: External ear normal.  Left Ear: External ear normal.  Nose: Mucosal edema and rhinorrhea present.  Mouth/Throat: No uvula swelling. Posterior oropharyngeal erythema present.  Eyes: EOM are normal.  Neck: Normal range of motion. Neck supple.  Cardiovascular: Normal rate, regular rhythm and normal heart sounds.   Pulmonary/Chest: Effort normal. Not tachypneic. She has no decreased breath sounds. She has no wheezes. She has rhonchi. She has no rales.  Lymphadenopathy:    She has no cervical adenopathy.  Neurological: She is alert and oriented to person, place, and time. Coordination normal.  Psychiatric: She has a normal mood and affect. Her behavior is normal.          Assessment & Plan:  Bronchitis Zpak/Tussionex bid prn

## 2013-06-19 NOTE — Telephone Encounter (Signed)
No one returned pt's call, closing encounter

## 2013-07-02 ENCOUNTER — Encounter: Payer: Self-pay | Admitting: Family Medicine

## 2013-07-10 ENCOUNTER — Encounter: Payer: Self-pay | Admitting: Family Medicine

## 2013-07-10 ENCOUNTER — Ambulatory Visit (INDEPENDENT_AMBULATORY_CARE_PROVIDER_SITE_OTHER): Payer: 59 | Admitting: Family Medicine

## 2013-07-10 VITALS — BP 122/82 | HR 91 | Temp 97.9°F | Resp 16 | Ht 69.0 in | Wt 160.0 lb

## 2013-07-10 DIAGNOSIS — F419 Anxiety disorder, unspecified: Secondary | ICD-10-CM

## 2013-07-10 DIAGNOSIS — G4700 Insomnia, unspecified: Secondary | ICD-10-CM

## 2013-07-10 DIAGNOSIS — F411 Generalized anxiety disorder: Secondary | ICD-10-CM

## 2013-07-10 DIAGNOSIS — J340 Abscess, furuncle and carbuncle of nose: Secondary | ICD-10-CM

## 2013-07-10 DIAGNOSIS — J3489 Other specified disorders of nose and nasal sinuses: Secondary | ICD-10-CM

## 2013-07-10 DIAGNOSIS — E039 Hypothyroidism, unspecified: Secondary | ICD-10-CM

## 2013-07-10 DIAGNOSIS — J0100 Acute maxillary sinusitis, unspecified: Secondary | ICD-10-CM

## 2013-07-10 DIAGNOSIS — G43719 Chronic migraine without aura, intractable, without status migrainosus: Secondary | ICD-10-CM

## 2013-07-10 LAB — POCT CBC
GRANULOCYTE PERCENT: 51.9 % (ref 37–80)
HCT, POC: 44.5 % (ref 37.7–47.9)
HEMOGLOBIN: 14.4 g/dL (ref 12.2–16.2)
LYMPH, POC: 3.1 (ref 0.6–3.4)
MCH, POC: 31 pg (ref 27–31.2)
MCHC: 32.4 g/dL (ref 31.8–35.4)
MCV: 95.7 fL (ref 80–97)
MID (cbc): 0.6 (ref 0–0.9)
MPV: 8.8 fL (ref 0–99.8)
POC GRANULOCYTE: 4 (ref 2–6.9)
POC LYMPH PERCENT: 39.8 %L (ref 10–50)
POC MID %: 8.3 % (ref 0–12)
Platelet Count, POC: 330 10*3/uL (ref 142–424)
RBC: 4.65 M/uL (ref 4.04–5.48)
RDW, POC: 14.1 %
WBC: 7.8 10*3/uL (ref 4.6–10.2)

## 2013-07-10 MED ORDER — AMOXICILLIN-POT CLAVULANATE 875-125 MG PO TABS: 1.0000 | ORAL_TABLET | Freq: Two times a day (BID) | ORAL | Status: DC

## 2013-07-10 MED ORDER — LEVOTHYROXINE SODIUM 88 MCG PO TABS: 88.0000 ug | ORAL_TABLET | Freq: Every day | ORAL | Status: DC

## 2013-07-10 MED ORDER — MUPIROCIN 2 % EX OINT: 1.0000 "application " | TOPICAL_OINTMENT | Freq: Three times a day (TID) | CUTANEOUS | Status: DC

## 2013-07-10 MED ORDER — ALPRAZOLAM 1 MG PO TABS: 1.0000 mg | ORAL_TABLET | Freq: Three times a day (TID) | ORAL | Status: DC | PRN

## 2013-07-10 NOTE — Progress Notes (Signed)
Subjective:  This chart was scribed for Wardell Honour, MD by Ludger Nutting, ED Scribe. This patient was seen in room 11 and the patient's care was started 3:41 PM.    Patient ID: Amber Gibbs, female    DOB: 1965-04-25, 48 y.o.   MRN: [id number]  HPI HPI Comments: Amber Gibbs is a 48 y.o. female who presents to [facility] complaining of constant, gradually worsening nasal congestion and L nasal pain that began 8-9 days ago. She states her L nasal passages have been painful and she is unable to blow her nose. She reports the left nasal passage is worse than the right. She has noticed green sputum when coughing but states it is not coming from her chest. She has associated left otalgia, sinus pressure, scratchy throat, postnasal drainage. She tried to clean her nose with hydrogen peroxide. She has been taking Allegra and Astelin regularly. She discontinued using Flonase. She denies chills, fever, chest congestion.   Migraines: She takes amitriptyline daily and Imitrex as needed for migraines. She states they have been under control recently.  Migraines worsened last month; had an eight day migraine and missed three days of work. Admits to home and work stressors.    Anxiety:  She states she is sleeping well. She states her oldest son recently moved out and has an apartment of his own. Patient states she was upset about this initially but states she is coming to terms with it. She states her other son is in a group home and she sees him once a week. Needs refill on Xanax tid. Patient reports good compliance with medication, good tolerance to medication, and good symptom control.    Hypothyroidism: one year follow-up; due for repeat labs.  Patient reports good compliance with medication, good tolerance to medication, and good symptom control.  Requesting trial of generic synthroid due to cost.    Past Medical History  Diagnosis Date  . Allergy   . Anxiety     with panic attacks  . GERD  (gastroesophageal reflux disease)   . Depression   . Thyroid disease     Hypothyroidism  . Benign neoplasm of skin, site unspecified   . Tobacco use disorder   . Dysfunctional alcohol use   . Insomnia, unspecified   . Dyspepsia and other specified disorders of function of stomach   . Breast lump     Right  . Chronic migraine without aura, with intractable migraine, so stated, without mention of status migrainosus 08/21/2012   Past Surgical History  Procedure Laterality Date  . Abdominal hysterectomy  2008    Right ovary removed Left ovary intact      Review of Systems  Constitutional: Positive for chills. Negative for fever, diaphoresis and fatigue.  HENT: Positive for congestion, ear pain (left sided), postnasal drip, rhinorrhea, sinus pressure and sore throat (scratchy). Negative for ear discharge, mouth sores, tinnitus, trouble swallowing and voice change.   Respiratory: Positive for cough and chest tightness. Negative for shortness of breath.   Endocrine: Negative for cold intolerance and heat intolerance.  Neurological: Positive for headaches. Negative for dizziness, tremors, facial asymmetry, weakness, light-headedness and numbness.  Psychiatric/Behavioral: Positive for sleep disturbance. Negative for suicidal ideas, self-injury and dysphoric mood. The patient is nervous/anxious.        Objective:   Physical Exam  Nursing note and vitals reviewed. Constitutional: She is oriented to person, place, and time. She appears well-developed and well-nourished. No distress.  HENT:  Head: Normocephalic  and atraumatic.  Right Ear: External ear normal.  Left Ear: External ear normal.  Nose: Sinus tenderness present. Right sinus exhibits maxillary sinus tenderness. Left sinus exhibits maxillary sinus tenderness.  Mouth/Throat: Oropharynx is clear and moist. No oropharyngeal exudate.  Diffuse erythema with ulcerations in the left nare. Tenderness to palpation of the maxillary sinuses  bilaterally.   Eyes: Conjunctivae and EOM are normal. Pupils are equal, round, and reactive to light.  Neck: Normal range of motion. Neck supple. No thyromegaly present.  Cardiovascular: Normal rate, regular rhythm and normal heart sounds.  Exam reveals no gallop and no friction rub.   No murmur heard. Pulmonary/Chest: Effort normal and breath sounds normal. No respiratory distress. She has no wheezes. She has no rales. She exhibits no tenderness.  Abdominal: She exhibits no distension.  Lymphadenopathy:    She has no cervical adenopathy.  Neurological: She is alert and oriented to person, place, and time.  Skin: Skin is warm and dry. She is not diaphoretic.  Psychiatric: She has a normal mood and affect. Her behavior is normal. Judgment and thought content normal.    Filed Vitals:   07/10/13 1454  BP: 122/82  Pulse: 91  Temp: 97.9 F (36.6 C)  TempSrc: Oral  Resp: 16  Height: 5\' 9"  (1.753 m)  Weight: 160 lb (72.576 kg)  SpO2: 98%       Assessment & Plan:   1. Hypothyroidism   2. Insomnia   3. Anxiety   4. Chronic migraine without aura, with intractable migraine, so stated, without mention of status migrainosus   5. Unspecified hypothyroidism   6. Acute maxillary sinusitis   7. Nasal ulcer     1. Hypothyroidism: controlled; obtain labs; refill of medication. 2.  Insomnia: controlled with Ambien, Amitriptyline. 3.  Anxiety: controlled; refill of Xanax tid.  Follow-up six months. 4.  Migraines: recent worsening; consider increasing Amitriptyline to 1.5 tablets qhs.  If continues to worsen, follow-up with neurology.  Continue Imitrex PRN first line for headaches. 5.  Acute maxillary sinusitis: New. Rx for Augmentin provided.   6.  Nasal ulcerations L:  New. Rx for Bactroban ointment provided.  Call if no improvement in 3-4 days.  May warrant MRSA coverage with Doxy.  I personally performed the services described in this documentation, which was scribed in my presence.  The  recorded information has been reviewed and is accurate.  Reginia Forts, M.D.  [facility] [address] [place], [place]  [address] [phone] phone [phone] fax

## 2013-07-10 NOTE — Telephone Encounter (Signed)
Called pt in regards to her mychart message. Advised pt that Dr. Tamala Julian is not in the office until 2 pm today. Asked her to come in around 145 for check in and she would be able to see Dr. Tamala Julian at our walk in clinic. Pt agrees and will be in to see you this afternoon.

## 2013-07-11 ENCOUNTER — Encounter: Payer: Self-pay | Admitting: Family Medicine

## 2013-07-11 LAB — TSH: TSH: 2.794 u[IU]/mL (ref 0.350–4.500)

## 2013-07-11 LAB — T4, FREE: FREE T4: 1.3 ng/dL (ref 0.80–1.80)

## 2013-07-12 NOTE — Progress Notes (Signed)
appt has been made with dr Tamala Julian

## 2013-07-17 ENCOUNTER — Ambulatory Visit: Payer: 59 | Admitting: Family Medicine

## 2013-07-26 ENCOUNTER — Encounter: Payer: Self-pay | Admitting: Family Medicine

## 2013-07-26 MED ORDER — ZOLPIDEM TARTRATE 5 MG PO TABS: 5.0000 mg | ORAL_TABLET | Freq: Every evening | ORAL | Status: DC | PRN

## 2013-07-26 MED ORDER — AZELASTINE HCL 0.1 % NA SOLN: 2.0000 | Freq: Two times a day (BID) | NASAL | Status: DC

## 2013-10-03 ENCOUNTER — Other Ambulatory Visit: Payer: Self-pay | Admitting: Family Medicine

## 2013-10-06 ENCOUNTER — Other Ambulatory Visit: Payer: Self-pay | Admitting: Physician Assistant

## 2013-12-30 ENCOUNTER — Other Ambulatory Visit: Payer: Self-pay | Admitting: Family Medicine

## 2013-12-31 ENCOUNTER — Encounter: Payer: Self-pay | Admitting: Family Medicine

## 2014-01-06 ENCOUNTER — Encounter: Payer: Self-pay | Admitting: Family Medicine

## 2014-01-06 DIAGNOSIS — G43719 Chronic migraine without aura, intractable, without status migrainosus: Secondary | ICD-10-CM

## 2014-01-06 MED ORDER — AMITRIPTYLINE HCL 25 MG PO TABS: 50.0000 mg | ORAL_TABLET | Freq: Every day | ORAL | Status: DC

## 2014-01-22 ENCOUNTER — Ambulatory Visit (INDEPENDENT_AMBULATORY_CARE_PROVIDER_SITE_OTHER): Payer: 59 | Admitting: Family Medicine

## 2014-01-22 VITALS — BP 121/84 | HR 82 | Temp 98.1°F | Resp 16 | Ht 69.0 in | Wt 163.0 lb

## 2014-01-22 DIAGNOSIS — K219 Gastro-esophageal reflux disease without esophagitis: Secondary | ICD-10-CM

## 2014-01-22 DIAGNOSIS — E039 Hypothyroidism, unspecified: Secondary | ICD-10-CM

## 2014-01-22 DIAGNOSIS — G43719 Chronic migraine without aura, intractable, without status migrainosus: Secondary | ICD-10-CM

## 2014-01-22 DIAGNOSIS — J309 Allergic rhinitis, unspecified: Secondary | ICD-10-CM | POA: Insufficient documentation

## 2014-01-22 DIAGNOSIS — E038 Other specified hypothyroidism: Secondary | ICD-10-CM

## 2014-01-22 DIAGNOSIS — G4700 Insomnia, unspecified: Secondary | ICD-10-CM

## 2014-01-22 DIAGNOSIS — J301 Allergic rhinitis due to pollen: Secondary | ICD-10-CM

## 2014-01-22 DIAGNOSIS — F419 Anxiety disorder, unspecified: Secondary | ICD-10-CM

## 2014-01-22 MED ORDER — ZOLPIDEM TARTRATE 5 MG PO TABS: 5.0000 mg | ORAL_TABLET | Freq: Every evening | ORAL | Status: DC | PRN

## 2014-01-22 MED ORDER — ALPRAZOLAM 1 MG PO TABS: 1.0000 mg | ORAL_TABLET | Freq: Three times a day (TID) | ORAL | Status: DC | PRN

## 2014-01-22 NOTE — Progress Notes (Signed)
Subjective:    Patient ID: Amber Gibbs, female    DOB: 1965/08/22, 48 y.o.   MRN: [id number]  HPI  Amber Gibbs is presenting for 6 month follow up.   Migraines - patient has a 1 year history of migraines. Initially, patient presented with stroke-like symptoms. She was referred to a Neurologist and has had an extensive work-up. Since then she has been diagnosed with complex migraines and managed with amitriptyline and sumatriptan. Today, Amber Gibbs reports that she generally has dull headaches a few times a week; however, she admits that they are much better controlled with her current regimen. Unfortunately, she still experiences a significant amount of stress with her adopted mentally challenged son, 9 years old, which brings about her headaches. He is currently living at [facility] in [place], [place]. Amber Gibbs travels there with her husband once a month to see him and bring him home for the weekend. They continue to work towards his progress with their son's management team and have a strong support network. Amber Gibbs admits that apart from that her marriage and work are excellent. Currently, despite having a few dull headaches a week, she does not want to make changes to her medications. Denies any other aggravating or relieving factors.  Insomnia - managed with Ambien, reports compliance with medication. States that she is sleeping much better, sleeps through the night, does not have trouble falling asleep or waking up too early.   Hypothyroidism - managed with Synthroid, reports compliance with medication. No adverse effects with Synthroid. ROS below.  Health Maintenance: plans on getting flu vaccine with [facility]. Reports that she is up to date with her immunizations. Last TDap within the last 10 years. Eating healthy, exercises (yoga) regularly.    Prior to Admission medications   Medication Sig Start Date End Date Taking? Authorizing Provider  ALPRAZolam Duanne Moron) 1 MG tablet  Take 1 tablet (1 mg total) by mouth 3 (three) times daily as needed for anxiety. 01/22/14  Yes Wardell Honour, MD  amitriptyline (ELAVIL) 25 MG tablet Take 2 tablets (50 mg total) by mouth at bedtime. Follow titration instruction. 01/06/14  Yes Wardell Honour, MD  azelastine (ASTELIN) 137 MCG/SPRAY nasal spray Place 2 sprays into both nostrils 2 (two) times daily. Use in each nostril as directed 07/26/13  Yes Wardell Honour, MD  fluticasone [facility]) 50 MCG/ACT nasal spray place 2 sprays into each nostril once daily   Yes Collene Leyden, PA-C  levothyroxine (SYNTHROID, LEVOTHROID) 88 MCG tablet Take 1 tablet (88 mcg total) by mouth daily before breakfast. 07/10/13  Yes Wardell Honour, MD  omeprazole (PRILOSEC) 20 MG capsule Take 1 capsule (20 mg total) by mouth daily. OTC 05/28/13  Yes Wardell Honour, MD  ondansetron (ZOFRAN-ODT) 8 MG disintegrating tablet dissolve 1 tablet ON TONGUE every 8 hours if needed for nausea 12/31/13  Yes Mancel Bale, PA-C  SUMAtriptan (IMITREX) 100 MG tablet take 1 tablet by mouth if needed for pain . MAY REPEAT IN TWO HOURS.DO NOT EXCEED 2 TABLETS IN 24 HOUR PERIOD 10/03/13  Yes Wardell Honour, MD  vitamin E 200 UNIT capsule Take 200 Units by mouth daily.   Yes Historical Provider, MD  zolpidem (AMBIEN) 5 MG tablet Take 1 tablet (5 mg total) by mouth at bedtime as needed for sleep. 01/22/14  Yes Wardell Honour, MD    Allergies  Allergen Reactions  . Sulfa Antibiotics Nausea And Vomiting  . Codeine Hives  Review of Systems  Constitutional: Negative for fever, chills, appetite change, fatigue and unexpected weight change.  HENT: Negative for congestion, ear pain, hearing loss, sinus pressure, sore throat and trouble swallowing.   Eyes: Negative for pain and visual disturbance.  Respiratory: Negative for cough, chest tightness, shortness of breath and wheezing.   Cardiovascular: Negative for chest pain, palpitations and leg swelling.  Gastrointestinal: Negative for  nausea, vomiting, abdominal pain, diarrhea, constipation and blood in stool.  Endocrine: Negative for polydipsia, polyphagia and polyuria.  Musculoskeletal: Negative for myalgias.  Skin: Negative for rash.  Neurological: Positive for headaches (as in HPI). Negative for dizziness, syncope, speech difficulty, weakness, light-headedness and numbness.  Psychiatric/Behavioral: Negative for agitation. The patient is not nervous/anxious.       Objective:   Physical Exam  Constitutional: She is oriented to person, place, and time. She appears well-developed and well-nourished. No distress.  BP 121/84  Pulse 82  Temp(Src) 98.1 F (36.7 C)  Resp 16  Ht 5\' 9"  (1.753 m)  Wt 163 lb (73.936 kg)  BMI 24.06 kg/m2  SpO2 99%   HENT:  Head: Normocephalic and atraumatic.  Right Ear: External ear normal.  Left Ear: External ear normal.  Nose: Nose normal.  Mouth/Throat: Oropharynx is clear and moist. No oropharyngeal exudate.  Eyes: Conjunctivae and EOM are normal. Pupils are equal, round, and reactive to light. No scleral icterus.  Neck: Normal range of motion. Neck supple. No thyromegaly present.  Cardiovascular: Normal rate, regular rhythm, normal heart sounds and intact distal pulses.  Exam reveals no gallop and no friction rub.   No murmur heard. Pulmonary/Chest: Effort normal and breath sounds normal. No respiratory distress. She has no wheezes. She has no rales.  Abdominal: Soft. Bowel sounds are normal. She exhibits no distension and no mass. There is no tenderness.  Musculoskeletal: Normal range of motion. She exhibits no edema and no tenderness.  Lymphadenopathy:    She has no cervical adenopathy.  Neurological: She is alert and oriented to person, place, and time. She has normal reflexes. No cranial nerve deficit.  Skin: Skin is warm and dry. No rash noted. She is not diaphoretic.  Psychiatric: She has a normal mood and affect. Her behavior is normal.       Assessment & Plan:   Mrs.  Gibbs is 48 y.o. female presenting for 6 month follow up. Overall, Amber Gibbs is doing very well. She is still having stressors with her son's situation but medically has significantly improved on multiple chronic issues. Plan for follow up in 6 months. Otherwise, plan for specific conditions below.  1. Intractable chronic migraine without aura and without status migrainosus Stable, continue sumatriptan and amitriptyline, Zofran for nausea associated with migraine, healthy diet and exercise  2. Hypothyroidism, unspecified hypothyroidism type Well controlled, continue Synthroid, repeat labs at next visit  3. Insomnia Stable, continue Ambien PRN  4. Anxiety Stable, continue Xanax PRN  5. Allergic rhinitis due to pollen Stable, continue Flonase and Astelin  6. Gastroesophageal reflux disease without esophagitis Stable, continue Prilosec   Jaynee Eagles, PA-C [facility] [phone] 01/22/2014 5:14 PM

## 2014-01-31 ENCOUNTER — Ambulatory Visit (INDEPENDENT_AMBULATORY_CARE_PROVIDER_SITE_OTHER): Payer: 59 | Admitting: Emergency Medicine

## 2014-01-31 ENCOUNTER — Ambulatory Visit (INDEPENDENT_AMBULATORY_CARE_PROVIDER_SITE_OTHER): Payer: 59

## 2014-01-31 VITALS — BP 122/76 | HR 93 | Temp 98.1°F | Resp 16 | Ht 69.0 in | Wt 164.0 lb

## 2014-01-31 DIAGNOSIS — S63501A Unspecified sprain of right wrist, initial encounter: Secondary | ICD-10-CM

## 2014-01-31 MED ORDER — NAPROXEN SODIUM 550 MG PO TABS: 550.0000 mg | ORAL_TABLET | Freq: Two times a day (BID) | ORAL | Status: DC

## 2014-01-31 NOTE — Patient Instructions (Signed)
Wrist Sprain °with Rehab °A sprain is an injury in which a ligament that maintains the proper alignment of a joint is partially or completely torn. The ligaments of the wrist are susceptible to sprains. Sprains are classified into three categories. Grade 1 sprains cause pain, but the tendon is not lengthened. Grade 2 sprains include a lengthened ligament because the ligament is stretched or partially ruptured. With grade 2 sprains there is still function, although the function may be diminished. Grade 3 sprains are characterized by a complete tear of the tendon or muscle, and function is usually impaired. °SYMPTOMS  °· Pain tenderness, inflammation, and/or bruising (contusion) of the injury. °· A "pop" or tear felt and/or heard at the time of injury. °· Decreased wrist function. °CAUSES  °A wrist sprain occurs when a force is placed on one or more ligaments that is greater than it/they can withstand. Common mechanisms of injury include: °· Catching a ball with you hands. °· Repetitive and/ or strenuous extension or flexion of the wrist. °RISK INCREASES WITH: °· Previous wrist injury. °· Contact sports (boxing or wrestling). °· Activities in which falling is common. °· Poor strength and flexibility. °· Improperly fitted or padded protective equipment. °PREVENTION °· Warm up and stretch properly before activity. °· Allow for adequate recovery between workouts. °· Maintain physical fitness: °· Strength, flexibility, and endurance. °· Cardiovascular fitness. °· Protect the wrist joint by limiting its motion with the use of taping, braces, or splints. °· Protect the wrist after injury for 6 to 12 months. °PROGNOSIS  °The prognosis for wrist sprains depends on the degree of injury. Grade 1 sprains require 2 to 6 weeks of treatment. Grade 2 sprains require 6 to 8 weeks of treatment, and grade 3 sprains require up to 12 weeks.  °RELATED COMPLICATIONS  °· Prolonged healing time, if improperly treated or  re-injured. °· Recurrent symptoms that result in a chronic problem. °· Injury to nearby structures (bone, cartilage, nerves, or tendons). °· Arthritis of the wrist. °· Inability to compete in athletics at a high level. °· Wrist stiffness or weakness. °· Progression to a complete rupture of the ligament. °TREATMENT  °Treatment initially involves resting from any activities that aggravate the symptoms, and the use of ice and medications to help reduce pain and inflammation. Your caregiver may recommend immobilizing the wrist for a period of time in order to reduce stress on the ligament and allow for healing. After immobilization it is important to perform strengthening and stretching exercises to help regain strength and a full range of motion. These exercises may be completed at home or with a therapist. Surgery is not usually required for wrist sprains, unless the ligament has been ruptured (grade 3 sprain). °MEDICATION  °· If pain medication is necessary, then nonsteroidal anti-inflammatory medications, such as aspirin and ibuprofen, or other minor pain relievers, such as acetaminophen, are often recommended. °· Do not take pain medication for 7 days before surgery. °· Prescription pain relievers may be given if deemed necessary by your caregiver. Use only as directed and only as much as you need. °HEAT AND COLD °· Cold treatment (icing) relieves pain and reduces inflammation. Cold treatment should be applied for 10 to 15 minutes every 2 to 3 hours for inflammation and pain and immediately after any activity that aggravates your symptoms. Use ice packs or massage the area with a piece of ice (ice massage). °· Heat treatment may be used prior to performing the stretching and strengthening activities prescribed by your   and pain and immediately after any activity that aggravates your symptoms. Use ice packs or massage the area with a piece of ice (ice massage).  · Heat treatment may be used prior to performing the stretching and strengthening activities prescribed by your caregiver, physical therapist, or athletic trainer. Use a heat pack or soak your injury in warm water.  SEEK MEDICAL CARE IF:  · Treatment seems to offer no benefit, or the condition worsens.  · Any  medications produce adverse side effects.  EXERCISES  RANGE OF MOTION (ROM) AND STRETCHING EXERCISES - Wrist Sprain   These exercises may help you when beginning to rehabilitate your injury. Your symptoms may resolve with or without further involvement from your physician, physical therapist or athletic trainer. While completing these exercises, remember:   · Restoring tissue flexibility helps normal motion to return to the joints. This allows healthier, less painful movement and activity.  · An effective stretch should be held for at least 30 seconds.  · A stretch should never be painful. You should only feel a gentle lengthening or release in the stretched tissue.  RANGE OF MOTION - Wrist Flexion, Active-Assisted  · Extend your right / left elbow with your fingers pointing down.*  · Gently pull the back of your hand towards you until you feel a gentle stretch on the top of your forearm.  · Hold this position for __________ seconds.  Repeat __________ times. Complete this exercise __________ times per day.   *If directed by your physician, physical therapist or athletic trainer, complete this stretch with your elbow bent rather than extended.  RANGE OF MOTION - Wrist Extension, Active-Assisted  · Extend your right / left elbow and turn your palm upwards.*  · Gently pull your palm/fingertips back so your wrist extends and your fingers point more toward the ground.  · You should feel a gentle stretch on the inside of your forearm.  · Hold this position for __________ seconds.  Repeat __________ times. Complete this exercise __________ times per day.  *If directed by your physician, physical therapist or athletic trainer, complete this stretch with your elbow bent, rather than extended.  RANGE OF MOTION - Supination, Active  · Stand or sit with your elbows at your side. Bend your right / left elbow to 90 degrees.  · Turn your palm upward until you feel a gentle stretch on the inside of your forearm.  · Hold this  position for __________ seconds. Slowly release and return to the starting position.  Repeat __________ times. Complete this stretch __________ times per day.   RANGE OF MOTION - Pronation, Active  · Stand or sit with your elbows at your side. Bend your right / left elbow to 90 degrees.  · Turn your palm downward until you feel a gentle stretch on the top of your forearm.  · Hold this position for __________ seconds. Slowly release and return to the starting position.  Repeat __________ times. Complete this stretch __________ times per day.   STRETCH - Wrist Flexion  · Place the back of your right / left hand on a tabletop leaving your elbow slightly bent. Your fingers should point away from your body.  · Gently press the back of your hand down onto the table by straightening your elbow. You should feel a stretch on the top of your forearm.  · Hold this position for __________ seconds.  Repeat __________ times. Complete this stretch __________ times per day.   STRETCH - Wrist   Extension  · Place your right / left fingertips on a tabletop leaving your elbow slightly bent. Your fingers should point backwards.  · Gently press your fingers and palm down onto the table by straightening your elbow. You should feel a stretch on the inside of your forearm.  · Hold this position for __________ seconds.  Repeat __________ times. Complete this stretch __________ times per day.   STRENGTHENING EXERCISES - Wrist Sprain  These exercises may help you when beginning to rehabilitate your injury. They may resolve your symptoms with or without further involvement from your physician, physical therapist or athletic trainer. While completing these exercises, remember:   · Muscles can gain both the endurance and the strength needed for everyday activities through controlled exercises.  · Complete these exercises as instructed by your physician, physical therapist or athletic trainer. Progress with the resistance and repetition exercises  only as your caregiver advises.  STRENGTH - Wrist Flexors  · Sit with your right / left forearm palm-up and fully supported. Your elbow should be resting below the height of your shoulder. Allow your wrist to extend over the edge of the surface.  · Loosely holding a __________ weight or a piece of rubber exercise band/tubing, slowly curl your hand up toward your forearm.  · Hold this position for __________ seconds. Slowly lower the wrist back to the starting position in a controlled manner.  Repeat __________ times. Complete this exercise __________ times per day.   STRENGTH - Wrist Extensors  · Sit with your right / left forearm palm-down and fully supported. Your elbow should be resting below the height of your shoulder. Allow your wrist to extend over the edge of the surface.  · Loosely holding a __________ weight or a piece of rubber exercise band/tubing, slowly curl your hand up toward your forearm.  · Hold this position for __________ seconds. Slowly lower the wrist back to the starting position in a controlled manner.  Repeat __________ times. Complete this exercise __________ times per day.   STRENGTH - Ulnar Deviators  · Stand with a ____________________ weight in your right / left hand, or sit holding on to the rubber exercise band/tubing with your opposite arm supported.  · Move your wrist so that your pinkie travels toward your forearm and your thumb moves away from your forearm.  · Hold this position for __________ seconds and then slowly lower the wrist back to the starting position.  Repeat __________ times. Complete this exercise __________ times per day  STRENGTH - Radial Deviators  · Stand with a ____________________ weight in your  · right / left hand, or sit holding on to the rubber exercise band/tubing with your arm supported.  · Raise your hand upward in front of you or pull up on the rubber tubing.  · Hold this position for __________ seconds and then slowly lower the wrist back to the  starting position.  Repeat __________ times. Complete this exercise __________ times per day.  STRENGTH - Forearm Supinators  · Sit with your right / left forearm supported on a table, keeping your elbow below shoulder height. Rest your hand over the edge, palm down.  · Gently grip a hammer or a soup ladle.  · Without moving your elbow, slowly turn your palm and hand upward to a "thumbs-up" position.  · Hold this position for __________ seconds. Slowly return to the starting position.  Repeat __________ times. Complete this exercise __________ times per day.   STRENGTH - Forearm   Pronators  · Sit with your right / left forearm supported on a table, keeping your elbow below shoulder height. Rest your hand over the edge, palm up.  · Gently grip a hammer or a soup ladle.  · Without moving your elbow, slowly turn your palm and hand upward to a "thumbs-up" position.  · Hold this position for __________ seconds. Slowly return to the starting position.  Repeat __________ times. Complete this exercise __________ times per day.   STRENGTH - Grip  · Grasp a tennis ball, a dense sponge, or a large, rolled sock in your hand.  · Squeeze as hard as you can without increasing any pain.  · Hold this position for __________ seconds. Release your grip slowly.  Repeat __________ times. Complete this exercise __________ times per day.   Document Released: 03/28/2005 Document Revised: 06/20/2011 Document Reviewed: 07/10/2008  Exit[employer]® Patient Information ©2015 [employer]. This information is not intended to replace advice given to you by your health care provider. Make sure you discuss any questions you have with your health care provider.
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
## 2014-02-04 ENCOUNTER — Encounter: Payer: Self-pay | Admitting: Family Medicine

## 2014-02-04 NOTE — Progress Notes (Signed)
History and physical examinations obtained with [facility], PA-C. Agree with assessment and plan.

## 2014-02-06 ENCOUNTER — Encounter (HOSPITAL_COMMUNITY): Payer: Self-pay | Admitting: Emergency Medicine

## 2014-02-06 ENCOUNTER — Emergency Department (HOSPITAL_COMMUNITY): Payer: 59

## 2014-02-06 ENCOUNTER — Emergency Department (HOSPITAL_COMMUNITY)
Admission: EM | Admit: 2014-02-06 | Discharge: 2014-02-06 | Disposition: A | Discharge status: Home / Self Care | Payer: 59 | Attending: Emergency Medicine | Admitting: Emergency Medicine

## 2014-02-06 ENCOUNTER — Other Ambulatory Visit: Payer: Self-pay

## 2014-02-06 DIAGNOSIS — Z791 Long term (current) use of non-steroidal anti-inflammatories (NSAID): Secondary | ICD-10-CM | POA: Insufficient documentation

## 2014-02-06 DIAGNOSIS — S161XXA Strain of muscle, fascia and tendon at neck level, initial encounter: Secondary | ICD-10-CM | POA: Diagnosis not present

## 2014-02-06 DIAGNOSIS — S199XXA Unspecified injury of neck, initial encounter: Secondary | ICD-10-CM | POA: Diagnosis present

## 2014-02-06 DIAGNOSIS — S20212A Contusion of left front wall of thorax, initial encounter: Secondary | ICD-10-CM | POA: Diagnosis not present

## 2014-02-06 DIAGNOSIS — Y92410 Unspecified street and highway as the place of occurrence of the external cause: Secondary | ICD-10-CM | POA: Diagnosis not present

## 2014-02-06 DIAGNOSIS — Z872 Personal history of diseases of the skin and subcutaneous tissue: Secondary | ICD-10-CM | POA: Diagnosis not present

## 2014-02-06 DIAGNOSIS — F419 Anxiety disorder, unspecified: Secondary | ICD-10-CM | POA: Diagnosis not present

## 2014-02-06 DIAGNOSIS — S139XXA Sprain of joints and ligaments of unspecified parts of neck, initial encounter: Secondary | ICD-10-CM | POA: Diagnosis not present

## 2014-02-06 DIAGNOSIS — F329 Major depressive disorder, single episode, unspecified: Secondary | ICD-10-CM | POA: Insufficient documentation

## 2014-02-06 DIAGNOSIS — Y9389 Activity, other specified: Secondary | ICD-10-CM | POA: Insufficient documentation

## 2014-02-06 DIAGNOSIS — E079 Disorder of thyroid, unspecified: Secondary | ICD-10-CM | POA: Insufficient documentation

## 2014-02-06 DIAGNOSIS — Z7951 Long term (current) use of inhaled steroids: Secondary | ICD-10-CM | POA: Diagnosis not present

## 2014-02-06 DIAGNOSIS — S0990XA Unspecified injury of head, initial encounter: Secondary | ICD-10-CM | POA: Insufficient documentation

## 2014-02-06 DIAGNOSIS — Z720 Tobacco use: Secondary | ICD-10-CM | POA: Insufficient documentation

## 2014-02-06 DIAGNOSIS — K219 Gastro-esophageal reflux disease without esophagitis: Secondary | ICD-10-CM | POA: Diagnosis not present

## 2014-02-06 DIAGNOSIS — G43719 Chronic migraine without aura, intractable, without status migrainosus: Secondary | ICD-10-CM | POA: Diagnosis not present

## 2014-02-06 DIAGNOSIS — Z79899 Other long term (current) drug therapy: Secondary | ICD-10-CM | POA: Diagnosis not present

## 2014-02-06 MED ORDER — ONDANSETRON 4 MG PO TBDP
4.0000 mg | ORAL_TABLET | Freq: Once | ORAL | Status: AC
  Administered 2014-02-06: 4 mg via ORAL
  Filled 2014-02-06: qty 1

## 2014-02-06 MED ORDER — ONDANSETRON 4 MG PO TBDP: 4.0000 mg | ORAL_TABLET | Freq: Four times a day (QID) | ORAL | Status: DC | PRN

## 2014-02-06 MED ORDER — OXYCODONE-ACETAMINOPHEN 5-325 MG PO TABS
2.0000 | ORAL_TABLET | Freq: Once | ORAL | Status: AC
  Administered 2014-02-06: 2 via ORAL
  Filled 2014-02-06: qty 2

## 2014-02-06 MED ORDER — OXYCODONE-ACETAMINOPHEN 5-325 MG PO TABS: 1.0000 | ORAL_TABLET | ORAL | Status: DC | PRN

## 2014-02-06 NOTE — ED Notes (Signed)
Pt using bedpan. 

## 2014-02-06 NOTE — ED Notes (Signed)
[employer] officer at bedside 
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
## 2014-03-10 ENCOUNTER — Encounter: Payer: Self-pay | Admitting: Family Medicine

## 2014-03-11 ENCOUNTER — Other Ambulatory Visit: Payer: Self-pay | Admitting: Family Medicine

## 2014-03-11 MED ORDER — TRAMADOL HCL 50 MG PO TABS: 50.0000 mg | ORAL_TABLET | Freq: Four times a day (QID) | ORAL | Status: DC | PRN

## 2014-03-11 NOTE — Telephone Encounter (Signed)
Please call in Tramadol rx as approved.

## 2014-03-11 NOTE — Telephone Encounter (Signed)
Called in and pt notified [facility].

## 2014-03-23 ENCOUNTER — Telehealth: Payer: 59 | Admitting: Physician Assistant

## 2014-03-23 DIAGNOSIS — J019 Acute sinusitis, unspecified: Secondary | ICD-10-CM

## 2014-03-23 MED ORDER — AMOXICILLIN-POT CLAVULANATE 875-125 MG PO TABS: 1.0000 | ORAL_TABLET | Freq: Two times a day (BID) | ORAL | Status: DC

## 2014-03-23 NOTE — Progress Notes (Signed)

## 2014-04-04 ENCOUNTER — Encounter: Payer: Self-pay | Admitting: Family Medicine

## 2014-04-04 ENCOUNTER — Telehealth: Payer: 59 | Admitting: Family

## 2014-04-04 DIAGNOSIS — J069 Acute upper respiratory infection, unspecified: Secondary | ICD-10-CM

## 2014-04-04 DIAGNOSIS — R059 Cough, unspecified: Secondary | ICD-10-CM

## 2014-04-04 DIAGNOSIS — R05 Cough: Secondary | ICD-10-CM

## 2014-04-04 MED ORDER — BENZONATATE 200 MG PO CAPS: 200.0000 mg | ORAL_CAPSULE | Freq: Three times a day (TID) | ORAL | Status: DC | PRN

## 2014-04-04 MED ORDER — LEVOFLOXACIN 500 MG PO TABS: 500.0000 mg | ORAL_TABLET | Freq: Every day | ORAL | Status: DC

## 2014-04-04 NOTE — Progress Notes (Signed)
We are sorry that you are not feeling well.  Here is how we plan to help!  Based on what you have shared with me it looks like you have upper respiratory tract inflammation that has resulted in a signification cough.  Inflammation and infection in the upper respiratory tract is commonly called bronchitis and has four common causes:  Allergies, Viral Infections, Acid Reflux and Bacterial Infections.  Allergies, viruses and acid reflux are treated by controlling symptoms or eliminating the cause. An example might be a cough caused by taking certain blood pressure medications. You stop the cough by changing the medication. Another example might be a cough caused by acid reflux. Controlling the reflux helps control the cough.  Based on your presentation I believe you most likely have A cough due to bacteria.  When patients have a fever and a productive cough with a change in color or increased sputum production, we are concerned about bacterial bronchitis.  If left untreated it can progress to pneumonia.  If your symptoms do not improve with your treatment plan it is important that you contact your provider.   I hve prescribed Levofloxacin 500 mg daily for 7 days   In addition you may use A non-prescription cough medication called Robitussin DAC. Take 2 teaspoons every 8 hours or Delsym: take 2 teaspoons every 12 hours., A non-prescription cough medication called Mucinex DM: take 2 tablets every 12 hours. and A prescription cough medication called Tessalon Perles 100mg . You may take 1-2 capsules every 8 hours as needed for your cough.    HOME CARE . Only take medications as instructed by your medical team. . Complete the entire course of an antibiotic. . Drink plenty of fluids and get plenty of rest. . Avoid close contacts especially the very young and the elderly . Cover your mouth if you cough or cough into your sleeve. . Always remember to wash your hands . A steam or ultrasonic humidifier can help  congestion.    GET HELP RIGHT AWAY IF: . You develop worsening fever. . You become short of breath . You cough up blood. . Your symptoms persist after you have completed your treatment plan MAKE SURE YOU   Understand these instructions.  Will watch your condition.  Will get help right away if you are not doing well or get worse.  Your e-visit answers were reviewed by a board certified advanced clinical practitioner to complete your personal care plan.  Depending on the condition, your plan could have included both over the counter or prescription medications.  If there is a problem please reply  once you have received a response from your provider.  Your safety is important to [place].  If you have drug allergies check your prescription carefully.    You can use MyChart to ask questions about today's visit, request a non-urgent call back, or ask for a work or school excuse.  You will get an e-mail in the next two days asking about your experience.  I hope that your e-visit has been valuable and will speed your recovery. Thank you for using e-visits.

## 2014-06-02 ENCOUNTER — Telehealth: Payer: Self-pay | Admitting: Nurse Practitioner

## 2014-06-02 ENCOUNTER — Encounter: Payer: Self-pay | Admitting: Family Medicine

## 2014-06-02 DIAGNOSIS — J0100 Acute maxillary sinusitis, unspecified: Secondary | ICD-10-CM

## 2014-06-02 MED ORDER — AZITHROMYCIN 250 MG PO TABS: ORAL_TABLET | ORAL | Status: DC

## 2014-06-02 NOTE — Progress Notes (Signed)

## 2014-07-22 ENCOUNTER — Other Ambulatory Visit: Payer: Self-pay

## 2014-07-22 MED ORDER — LEVOTHYROXINE SODIUM 88 MCG PO TABS: 88.0000 ug | ORAL_TABLET | Freq: Every day | ORAL | Status: DC

## 2014-07-23 ENCOUNTER — Ambulatory Visit (INDEPENDENT_AMBULATORY_CARE_PROVIDER_SITE_OTHER): Payer: 59 | Admitting: Family Medicine

## 2014-07-23 ENCOUNTER — Encounter: Payer: Self-pay | Admitting: Family Medicine

## 2014-07-23 VITALS — BP 132/82 | HR 70 | Temp 98.3°F | Resp 16 | Ht 70.0 in | Wt 154.0 lb

## 2014-07-23 DIAGNOSIS — E038 Other specified hypothyroidism: Secondary | ICD-10-CM | POA: Diagnosis not present

## 2014-07-23 DIAGNOSIS — J0101 Acute recurrent maxillary sinusitis: Secondary | ICD-10-CM

## 2014-07-23 DIAGNOSIS — J301 Allergic rhinitis due to pollen: Secondary | ICD-10-CM

## 2014-07-23 DIAGNOSIS — K219 Gastro-esophageal reflux disease without esophagitis: Secondary | ICD-10-CM | POA: Diagnosis not present

## 2014-07-23 DIAGNOSIS — E034 Atrophy of thyroid (acquired): Secondary | ICD-10-CM

## 2014-07-23 DIAGNOSIS — G4700 Insomnia, unspecified: Secondary | ICD-10-CM

## 2014-07-23 DIAGNOSIS — F329 Major depressive disorder, single episode, unspecified: Secondary | ICD-10-CM

## 2014-07-23 DIAGNOSIS — Z5181 Encounter for therapeutic drug level monitoring: Secondary | ICD-10-CM | POA: Diagnosis not present

## 2014-07-23 DIAGNOSIS — F32A Depression, unspecified: Secondary | ICD-10-CM

## 2014-07-23 DIAGNOSIS — F419 Anxiety disorder, unspecified: Secondary | ICD-10-CM

## 2014-07-23 DIAGNOSIS — F418 Other specified anxiety disorders: Secondary | ICD-10-CM

## 2014-07-23 LAB — CBC WITH DIFFERENTIAL/PLATELET
Basophils Absolute: 0.1 10*3/uL (ref 0.0–0.1)
Basophils Relative: 1 % (ref 0–1)
EOS PCT: 1 % (ref 0–5)
Eosinophils Absolute: 0.1 10*3/uL (ref 0.0–0.7)
HCT: 42.4 % (ref 36.0–46.0)
HEMOGLOBIN: 14.4 g/dL (ref 12.0–15.0)
LYMPHS ABS: 3.1 10*3/uL (ref 0.7–4.0)
LYMPHS PCT: 41 % (ref 12–46)
MCH: 31.1 pg (ref 26.0–34.0)
MCHC: 34 g/dL (ref 30.0–36.0)
MCV: 91.6 fL (ref 78.0–100.0)
MPV: 10.5 fL (ref 8.6–12.4)
Monocytes Absolute: 0.6 10*3/uL (ref 0.1–1.0)
Monocytes Relative: 8 % (ref 3–12)
Neutro Abs: 3.7 10*3/uL (ref 1.7–7.7)
Neutrophils Relative %: 49 % (ref 43–77)
Platelets: 295 10*3/uL (ref 150–400)
RBC: 4.63 MIL/uL (ref 3.87–5.11)
RDW: 14.1 % (ref 11.5–15.5)
WBC: 7.6 10*3/uL (ref 4.0–10.5)

## 2014-07-23 LAB — COMPREHENSIVE METABOLIC PANEL
ALT: 19 U/L (ref 0–35)
AST: 22 U/L (ref 0–37)
Albumin: 4.5 g/dL (ref 3.5–5.2)
Alkaline Phosphatase: 63 U/L (ref 39–117)
BUN: 6 mg/dL (ref 6–23)
CALCIUM: 10.2 mg/dL (ref 8.4–10.5)
CHLORIDE: 102 meq/L (ref 96–112)
CO2: 24 mEq/L (ref 19–32)
CREATININE: 0.66 mg/dL (ref 0.50–1.10)
Glucose, Bld: 93 mg/dL (ref 70–99)
POTASSIUM: 4.2 meq/L (ref 3.5–5.3)
SODIUM: 138 meq/L (ref 135–145)
Total Bilirubin: 0.4 mg/dL (ref 0.2–1.2)
Total Protein: 7.6 g/dL (ref 6.0–8.3)

## 2014-07-23 MED ORDER — LEVOTHYROXINE SODIUM 88 MCG PO TABS: 88.0000 ug | ORAL_TABLET | Freq: Every day | ORAL | Status: DC

## 2014-07-23 MED ORDER — FLUTICASONE PROPIONATE 50 MCG/ACT NA SUSP: NASAL | Status: DC

## 2014-07-23 MED ORDER — ZOLPIDEM TARTRATE 5 MG PO TABS: 5.0000 mg | ORAL_TABLET | Freq: Every evening | ORAL | Status: DC | PRN

## 2014-07-23 MED ORDER — AZELASTINE HCL 0.1 % NA SOLN: 2.0000 | Freq: Two times a day (BID) | NASAL | Status: DC

## 2014-07-23 MED ORDER — LEVOFLOXACIN 750 MG PO TABS: 750.0000 mg | ORAL_TABLET | Freq: Every day | ORAL | Status: DC

## 2014-07-23 MED ORDER — ALPRAZOLAM 1 MG PO TABS: 1.0000 mg | ORAL_TABLET | Freq: Three times a day (TID) | ORAL | Status: DC | PRN

## 2014-07-23 NOTE — Progress Notes (Signed)
Subjective:    Patient ID: Amber Gibbs, female    DOB: Sep 12, 1965, 49 y.o.   MRN: [id number]  07/23/2014  Follow-up; Sinusitis; Hypothyroidism; Anxiety; Insomnia; and Migraine   HPI This 49 y.o. female presents for six month follow-up:  1. Hypothyroidism:  Patient reports good compliance with medication, good tolerance to medication, and good symptom control.  Due for repeat labs.   2.  Anxiety and depression: doing really well.  Husband and pt got separated; had a business relationship.  Lives in [place] now; has one hour commute.  All of extended family live in [place]; wanted to start over in [place]. [place] [address]  Taking Xanax 1mg  tid.  Not dating; has a friend who is first crush in high school.  Grew up together; went to school together.  Up for promotion in 12/2013.     3.  Insomnia:  Sleeping well and much better for the most part; may need to take Ambien 3-4 times per month on average.    4.  GERD: stopped Prilosec 03/2014.  Eating better, working out; doing yoga. Denies n/v/d/c; denies melena or bloody stools; denies abdominal pain.   5. Allergic rhinitis: recurrent congestion one week ago; taking Allegra 180mg  daily; out of both nasal sprays; ran out of nose sprays one month ago; wanted to try without medication.  6. Migraines: stopped Amitriptyline, imitrex, tramadol.  No migraines since stopping medications.    6.  Health maintenance: Last physical: Pap smear:  2014 Mammogram:  01/23/2012; did not do one last year; [place]. Colonoscopy:  Age 84. Bone density: TDAP:  2012 Influenza:  10/12015 Eye exam:  Glasses one year ago.  +contacts. Dental exam:  Every six months.  [place].    Review of Systems  Constitutional: Negative for fever, chills, diaphoresis and fatigue.  HENT: Positive for congestion, postnasal drip, rhinorrhea, sinus pressure, sneezing and voice change. Negative for ear pain, sore throat and trouble swallowing.   Eyes: Negative  for visual disturbance.  Respiratory: Negative for cough and shortness of breath.   Cardiovascular: Negative for chest pain, palpitations and leg swelling.  Gastrointestinal: Negative for nausea, vomiting, abdominal pain, diarrhea and constipation.  Endocrine: Negative for cold intolerance, heat intolerance, polydipsia, polyphagia and polyuria.  Skin: Negative for rash and wound.  Neurological: Negative for dizziness, tremors, seizures, syncope, facial asymmetry, speech difficulty, weakness, light-headedness, numbness and headaches.  Psychiatric/Behavioral: Negative for suicidal ideas, sleep disturbance, self-injury and dysphoric mood. The patient is nervous/anxious.     Past Medical History  Diagnosis Date  . Allergy   . Anxiety     with panic attacks  . GERD (gastroesophageal reflux disease)   . Depression   . Benign neoplasm of skin, site unspecified   . Tobacco use disorder   . Dysfunctional alcohol use   . Insomnia, unspecified   . Dyspepsia and other specified disorders of function of stomach   . Breast lump     Right  . Chronic migraine without aura, with intractable migraine, so stated, without mention of status migrainosus 08/21/2012  . Thyroid disease     Hypothyroidism   Past Surgical History  Procedure Laterality Date  . Abdominal hysterectomy  2008    Right ovary removed Left ovary intact   Allergies  Allergen Reactions  . Sulfa Antibiotics Nausea And Vomiting  . Codeine Hives   Current Outpatient Prescriptions  Medication Sig Dispense Refill  . ALPRAZolam (XANAX) 1 MG tablet Take 1 tablet (1 mg  total) by mouth 3 (three) times daily as needed for anxiety. 90 tablet 5  . azelastine (ASTELIN) 0.1 % nasal spray Place 2 sprays into both nostrils 2 (two) times daily. Use in each nostril as directed 30 mL 11  . fluticasone (FLONASE) 50 MCG/ACT nasal spray place 2 sprays into each nostril once daily 16 g 11  . levothyroxine (SYNTHROID, LEVOTHROID) 88 MCG tablet Take 1  tablet (88 mcg total) by mouth daily before breakfast. 30 tablet 11  . ondansetron (ZOFRAN-ODT) 8 MG disintegrating tablet dissolve 1 tablet ON TONGUE every 8 hours if needed for nausea 21 tablet 5  . vitamin E 200 UNIT capsule Take 200 Units by mouth daily.    Marland Kitchen zolpidem (AMBIEN) 5 MG tablet Take 1 tablet (5 mg total) by mouth at bedtime as needed for sleep. 30 tablet 2  . levofloxacin (LEVAQUIN) 750 MG tablet Take 1 tablet (750 mg total) by mouth daily. 5 tablet 0  . SUMAtriptan (IMITREX) 100 MG tablet take 1 tablet by mouth if needed for pain . MAY REPEAT IN TWO HOURS.DO NOT EXCEED 2 TABLETS IN 58 HOUR PERIOD (Patient not taking: Reported on 07/23/2014) 9 tablet 3   No current facility-administered medications for this visit.       Objective:    BP 132/82 mmHg  Pulse 70  Temp(Src) 98.3 F (36.8 C) (Oral)  Resp 16  Ht 5\' 10"  (1.778 m)  Wt 154 lb (69.854 kg)  BMI 22.10 kg/m2  SpO2 99% Physical Exam  Constitutional: She is oriented to person, place, and time. She appears well-developed and well-nourished. No distress.  HENT:  Head: Normocephalic and atraumatic.  Right Ear: Tympanic membrane, external ear and ear canal normal.  Left Ear: Tympanic membrane, external ear and ear canal normal.  Nose: Mucosal edema and rhinorrhea present. Right sinus exhibits maxillary sinus tenderness. Right sinus exhibits no frontal sinus tenderness. Left sinus exhibits maxillary sinus tenderness. Left sinus exhibits no frontal sinus tenderness.  Mouth/Throat: Oropharynx is clear and moist and mucous membranes are normal.  Eyes: Conjunctivae and EOM are normal. Pupils are equal, round, and reactive to light.  Neck: Normal range of motion. Neck supple. Carotid bruit is not present. No thyromegaly present.  Cardiovascular: Normal rate, regular rhythm, normal heart sounds and intact distal pulses.  Exam reveals no gallop and no friction rub.   No murmur heard. Pulmonary/Chest: Effort normal and breath  sounds normal. She has no wheezes. She has no rales.  Abdominal: Soft. Bowel sounds are normal. She exhibits no distension and no mass. There is no tenderness. There is no rebound and no guarding.  Lymphadenopathy:    She has no cervical adenopathy.  Neurological: She is alert and oriented to person, place, and time. No cranial nerve deficit.  Skin: Skin is warm and dry. No rash noted. She is not diaphoretic. No erythema. No pallor.  Psychiatric: She has a normal mood and affect. Her behavior is normal.        Assessment & Plan:   1. Hypothyroidism due to acquired atrophy of thyroid   2. Anxiety and depression   3. Medication monitoring encounter   4. Gastroesophageal reflux disease without esophagitis   5. Insomnia   6. Allergic rhinitis due to pollen   7. Acute recurrent maxillary sinusitis     1. Hypothyroidism: controlled; obtain labs; refill provided. 2. Anxiety and depression: improved despite recent separation from husband; counseling provided; refill of Xanax 1mg  tid provided; consider weaning to bid in upcoming  six months. 3.  GERD: resolved; stopped PPI in past three months. 4.  Insomnia:  Improved; taking Ambien PRN; stopped Amitriptyline.  Refill of Ambien provided. 5.  Allergic Rhinitis: uncontrolled; restart Astelin and Flonase.   6. Acute maxillary sinusitis:  New.  Rx for Levaquin 750mg  daily for 5 days; restart nasal sprays.    Meds ordered this encounter  Medications  . ALPRAZolam (XANAX) 1 MG tablet    Sig: Take 1 tablet (1 mg total) by mouth 3 (three) times daily as needed for anxiety.    Dispense:  90 tablet    Refill:  5  . levothyroxine (SYNTHROID, LEVOTHROID) 88 MCG tablet    Sig: Take 1 tablet (88 mcg total) by mouth daily before breakfast.    Dispense:  30 tablet    Refill:  11    Generic is fine  . azelastine (ASTELIN) 0.1 % nasal spray    Sig: Place 2 sprays into both nostrils 2 (two) times daily. Use in each nostril as directed    Dispense:  30  mL    Refill:  11  . fluticasone (FLONASE) 50 MCG/ACT nasal spray    Sig: place 2 sprays into each nostril once daily    Dispense:  16 g    Refill:  11  . zolpidem (AMBIEN) 5 MG tablet    Sig: Take 1 tablet (5 mg total) by mouth at bedtime as needed for sleep.    Dispense:  30 tablet    Refill:  2  . levofloxacin (LEVAQUIN) 750 MG tablet    Sig: Take 1 tablet (750 mg total) by mouth daily.    Dispense:  5 tablet    Refill:  0    Return in about 6 months (around 01/22/2015) for recheck.     Terion Hedman Elayne Guerin, M.D. [facility] [address] [place], [place]  [address] [phone] phone [phone] fax

## 2014-07-24 LAB — TSH: TSH: 5.96 u[IU]/mL — AB (ref 0.350–4.500)

## 2014-07-24 LAB — VITAMIN B12: Vitamin B-12: 496 pg/mL (ref 211–911)

## 2014-07-24 LAB — T4, FREE: FREE T4: 1.05 ng/dL (ref 0.80–1.80)

## 2014-07-24 LAB — VITAMIN D 25 HYDROXY (VIT D DEFICIENCY, FRACTURES): Vit D, 25-Hydroxy: 47 ng/mL (ref 30–100)

## 2014-07-30 ENCOUNTER — Encounter: Payer: Self-pay | Admitting: Family Medicine

## 2014-08-01 MED ORDER — LEVOFLOXACIN 750 MG PO TABS: 750.0000 mg | ORAL_TABLET | Freq: Every day | ORAL | Status: DC

## 2015-01-14 ENCOUNTER — Telehealth: Payer: Self-pay

## 2015-01-14 ENCOUNTER — Other Ambulatory Visit: Payer: Self-pay | Admitting: Family Medicine

## 2015-01-14 MED ORDER — ALPRAZOLAM 1 MG PO TABS: 1.0000 mg | ORAL_TABLET | Freq: Three times a day (TID) | ORAL | Status: DC | PRN

## 2015-01-14 NOTE — Telephone Encounter (Signed)
Please call in Alprazolam 1mg  refill as approved; do not fill until 01-22-15.

## 2015-01-14 NOTE — Telephone Encounter (Signed)
Pt would like a refill on her ALPRAZolam (XANAX) 1 MG tablet [[id number]]. She has an appointment set up on 11/4 and needs this until she can make it back. Pharmacy is [facility] on [address] in [place]. Please advise at [phone] or [phone]

## 2015-01-15 NOTE — Telephone Encounter (Signed)
Faxed and notified pt

## 2015-01-20 ENCOUNTER — Telehealth: Payer: Self-pay | Admitting: Family

## 2015-01-20 ENCOUNTER — Encounter: Payer: Self-pay | Admitting: Family Medicine

## 2015-01-20 DIAGNOSIS — B349 Viral infection, unspecified: Secondary | ICD-10-CM

## 2015-01-20 DIAGNOSIS — J329 Chronic sinusitis, unspecified: Secondary | ICD-10-CM

## 2015-01-20 DIAGNOSIS — B9789 Other viral agents as the cause of diseases classified elsewhere: Secondary | ICD-10-CM

## 2015-01-20 MED ORDER — FLUTICASONE PROPIONATE 50 MCG/ACT NA SUSP: NASAL | Status: DC

## 2015-01-20 MED ORDER — AZITHROMYCIN 250 MG PO TABS: ORAL_TABLET | ORAL | Status: DC

## 2015-01-20 NOTE — Progress Notes (Signed)
No antibiotic is required for Viral illnesses. You likely have viral sinusitis with symptoms less than 7 days w/o fever.  Flonase was prescribed.

## 2015-01-20 NOTE — Progress Notes (Signed)
We are sorry that you are not feeling well.  Here is how we plan to help!  Based on what you have shared with me it looks like you have sinusitis.  Sinusitis is inflammation and infection in the sinus cavities of the head.  Based on your presentation I believe you most likely have Acute Viral Sinusitis. This is an infection most likely caused by a virus. There is not specific treatment for viral sinusitis other than to help you with the symptoms until the infection runs its course. Toy may use an oral decongestant such as Mucinex D or if you have Glaucoma or high blood pressure use plain Mucinex. Saline Nasal spray helps and can safely be used as often as needed for congestion. Additionally, I have prescribed Flonase nasal spray two sprays in each nostril twice a day to help reduce your symptoms.   Sinus infections are not as easily transmitted as other respiratory infection, however we still recommend that you avoid close contact with loved ones, especially the very young and elderly.  Remember to wash your hands thoroughly throughout the day as this is the number one way to prevent the spread of infection!  Home Care:  Only take medications as instructed by your medical team.  Complete the entire course of an antibiotic.  Do not take these medications with alcohol.  A steam or ultrasonic humidifier can help congestion.  You can place a towel over your head and breathe in the steam from hot water coming from a faucet.  Avoid close contacts especially the very young and the elderly.  Cover your mouth when you cough or sneeze.  Always remember to wash your hands.  Get Help Right Away If:  You develop worsening fever or sinus pain.  You develop a severe head ache or visual changes.  Your symptoms persist after you have completed your treatment plan.  Make sure you  Understand these instructions.  Will watch your condition.  Will get help right away if you are not doing well or get  worse.  Your e-visit answers were reviewed by a board certified advanced clinical practitioner to complete your personal care plan.  Depending on the condition, your plan could have included both over the counter or prescription medications.  If there is a problem please reply  once you have received a response from your provider.  Your safety is important to [place].  If you have drug allergies check your prescription carefully.    You can use MyChart to ask questions about today's visit, request a non-urgent call back, or ask for a work or school excuse for 24 hours related to this e-Visit. If it has been greater than 24 hours you will need to follow up with your provider, or enter a new e-Visit to address those concerns.  You will get an e-mail in the next two days asking about your experience.  I hope that your e-visit has been valuable and will speed your recovery. Thank you for using e-visits.

## 2015-01-20 NOTE — Addendum Note (Signed)
Addended by: Dutch Quint B on: 01/20/2015 03:58 PM   Modules accepted: Orders

## 2015-01-20 NOTE — Progress Notes (Signed)
No worries. Your records were thoroughly reviewed with your initial consultation. I renewed your Flonase from April. However, at your request, I have sent in a Zpak. Take 2 tabs today, then 1 tab a day x 4 more days with food. If you need Zofran, please contact your primary care provider as this is not typically our protocol. I wish you a speedy recovery! Thank you for using E-visit.   PW

## 2015-01-23 ENCOUNTER — Encounter: Payer: Self-pay | Admitting: Family Medicine

## 2015-02-11 ENCOUNTER — Ambulatory Visit: Payer: Self-pay | Admitting: Family Medicine

## 2015-02-13 ENCOUNTER — Encounter: Payer: Self-pay | Admitting: Family Medicine

## 2015-02-13 ENCOUNTER — Ambulatory Visit: Payer: Self-pay | Admitting: Family Medicine

## 2015-02-13 ENCOUNTER — Ambulatory Visit (INDEPENDENT_AMBULATORY_CARE_PROVIDER_SITE_OTHER): Payer: 59 | Admitting: Family Medicine

## 2015-02-13 ENCOUNTER — Ambulatory Visit (INDEPENDENT_AMBULATORY_CARE_PROVIDER_SITE_OTHER): Payer: 59

## 2015-02-13 VITALS — BP 122/80 | HR 85 | Temp 97.9°F | Resp 16 | Wt 154.0 lb

## 2015-02-13 DIAGNOSIS — J9801 Acute bronchospasm: Secondary | ICD-10-CM

## 2015-02-13 DIAGNOSIS — G4700 Insomnia, unspecified: Secondary | ICD-10-CM

## 2015-02-13 DIAGNOSIS — R112 Nausea with vomiting, unspecified: Secondary | ICD-10-CM | POA: Diagnosis not present

## 2015-02-13 DIAGNOSIS — F419 Anxiety disorder, unspecified: Secondary | ICD-10-CM

## 2015-02-13 DIAGNOSIS — R509 Fever, unspecified: Secondary | ICD-10-CM

## 2015-02-13 DIAGNOSIS — E038 Other specified hypothyroidism: Secondary | ICD-10-CM

## 2015-02-13 DIAGNOSIS — G43719 Chronic migraine without aura, intractable, without status migrainosus: Secondary | ICD-10-CM

## 2015-02-13 DIAGNOSIS — J0101 Acute recurrent maxillary sinusitis: Secondary | ICD-10-CM

## 2015-02-13 DIAGNOSIS — J301 Allergic rhinitis due to pollen: Secondary | ICD-10-CM | POA: Diagnosis not present

## 2015-02-13 DIAGNOSIS — E034 Atrophy of thyroid (acquired): Secondary | ICD-10-CM | POA: Diagnosis not present

## 2015-02-13 LAB — COMPREHENSIVE METABOLIC PANEL
ALT: 27 U/L (ref 6–29)
AST: 24 U/L (ref 10–35)
Albumin: 4.6 g/dL (ref 3.6–5.1)
Alkaline Phosphatase: 88 U/L (ref 33–115)
BUN: 8 mg/dL (ref 7–25)
CHLORIDE: 101 mmol/L (ref 98–110)
CO2: 25 mmol/L (ref 20–31)
Calcium: 10 mg/dL (ref 8.6–10.2)
Creat: 0.64 mg/dL (ref 0.50–1.10)
Glucose, Bld: 101 mg/dL — ABNORMAL HIGH (ref 65–99)
Potassium: 3.9 mmol/L (ref 3.5–5.3)
SODIUM: 134 mmol/L — AB (ref 135–146)
TOTAL PROTEIN: 7.8 g/dL (ref 6.1–8.1)
Total Bilirubin: 0.5 mg/dL (ref 0.2–1.2)

## 2015-02-13 LAB — POCT CBC
Granulocyte percent: 53.9 %G (ref 37–80)
HEMATOCRIT: 47.1 % (ref 37.7–47.9)
HEMOGLOBIN: 16.1 g/dL (ref 12.2–16.2)
Lymph, poc: 3.7 — AB (ref 0.6–3.4)
MCH, POC: 31.6 pg — AB (ref 27–31.2)
MCHC: 34.1 g/dL (ref 31.8–35.4)
MCV: 92.6 fL (ref 80–97)
MID (cbc): 0.3 (ref 0–0.9)
MPV: 7.6 fL (ref 0–99.8)
POC GRANULOCYTE: 4.6 (ref 2–6.9)
POC LYMPH PERCENT: 42.7 %L (ref 10–50)
POC MID %: 3.4 %M (ref 0–12)
Platelet Count, POC: 297 10*3/uL (ref 142–424)
RBC: 5.09 M/uL (ref 4.04–5.48)
RDW, POC: 12.6 %
WBC: 8.6 10*3/uL (ref 4.6–10.2)

## 2015-02-13 LAB — POCT INFLUENZA A/B
Influenza A, POC: NEGATIVE
Influenza B, POC: NEGATIVE

## 2015-02-13 LAB — TSH: TSH: 1.758 u[IU]/mL (ref 0.350–4.500)

## 2015-02-13 LAB — T4, FREE: Free T4: 1.21 ng/dL (ref 0.80–1.80)

## 2015-02-13 MED ORDER — ALBUTEROL SULFATE (2.5 MG/3ML) 0.083% IN NEBU
2.5000 mg | INHALATION_SOLUTION | Freq: Once | RESPIRATORY_TRACT | Status: AC
  Administered 2015-02-13: 2.5 mg via RESPIRATORY_TRACT

## 2015-02-13 MED ORDER — ZOLPIDEM TARTRATE 5 MG PO TABS: 5.0000 mg | ORAL_TABLET | Freq: Every evening | ORAL | Status: AC | PRN

## 2015-02-13 MED ORDER — ALBUTEROL SULFATE HFA 108 (90 BASE) MCG/ACT IN AERS: 2.0000 | INHALATION_SPRAY | Freq: Four times a day (QID) | RESPIRATORY_TRACT | Status: AC | PRN

## 2015-02-13 MED ORDER — MUCINEX DM 30-600 MG PO TB12: 1.0000 | ORAL_TABLET | Freq: Two times a day (BID) | ORAL | Status: DC | PRN

## 2015-02-13 MED ORDER — HYDROCOD POLST-CPM POLST ER 10-8 MG/5ML PO SUER: 5.0000 mL | Freq: Two times a day (BID) | ORAL | Status: DC | PRN

## 2015-02-13 MED ORDER — PREDNISONE 20 MG PO TABS: ORAL_TABLET | ORAL | Status: DC

## 2015-02-13 MED ORDER — ALPRAZOLAM 1 MG PO TABS: 1.0000 mg | ORAL_TABLET | Freq: Three times a day (TID) | ORAL | Status: DC | PRN

## 2015-02-13 MED ORDER — LEVOFLOXACIN 750 MG PO TABS: 750.0000 mg | ORAL_TABLET | Freq: Every day | ORAL | Status: DC

## 2015-02-13 NOTE — Progress Notes (Signed)
Subjective:    Patient ID: Amber Gibbs, female    DOB: [birth year], 49 y.o.   MRN: [id number]  02/13/2015  Medication Refill; Cough; Chills; bodyaches; and fever/sweating   HPI This 49 y.o. female presents for six month follow-up.  1.  Cough: onset one month ago.  Did evisit and prescribed Zpack early October; got a little better then never recovered. Missed nine days of work this month.  Went to Urgent Care in [place] last nine days ago; diagnosed with strep; prescribed Augmentin; completed Augmentin yesterday. No better.  Feels horrible.  +fever Tmax 103; +chills; +diarrhea; +vomiting.  Throat is so raw and sore.  Horrible cough.  Chest is hurting from coughing so much.  +HA  +L > R ear pain.  +rhinorrhea; +yellow nasal congestion.  +PND; +coughing; cannot lay down and sleeping; starting coughing horrible.  Cannot get mucous up and out; mucous is hung.  Sparingly sputum yellowish.  Wheezing; +SOB.  Fever started ten days ago; fever has continued; last fever this morning.  Vomiting and diarrhea started two days ago; yesterday vomited 4 times and 2 times today; not sure if due to choking up on sputum.  Diarrhea yesterday 4-5 times; 3 today; non-bloody.  Taking Allegra daily; no cough medication; cannot take medication with Synthroid; prescribed Tussionex with some relief but only supply for three days.   2.  Anxiety and depression:  Patient reports good compliance with medication, good tolerance to medication, and good symptom control.  Emotionally doing really well.  Needs refill of Xanax.  Taking tid.    3. Insomnia: stable; using Ambien sparingly; only prescribed 1-2 refills at last visit; still has remaining pills.   4. Migraines: no recurrent migraines since last visit; stopped all medications.  5.  Hypothyroidism: Patient reports good compliance with medication, good tolerance to medication, and good symptom control.      Review of Systems  Constitutional: Positive for fever,  chills, diaphoresis and fatigue.  HENT: Positive for congestion, ear pain, postnasal drip, rhinorrhea, sinus pressure, sore throat, trouble swallowing and voice change.   Eyes: Negative for visual disturbance.  Respiratory: Positive for cough, shortness of breath and wheezing.   Cardiovascular: Negative for chest pain, palpitations and leg swelling.  Gastrointestinal: Positive for vomiting and diarrhea. Negative for nausea, abdominal pain and constipation.  Endocrine: Negative for cold intolerance, heat intolerance, polydipsia, polyphagia and polyuria.  Neurological: Negative for dizziness, tremors, seizures, syncope, facial asymmetry, speech difficulty, weakness, light-headedness, numbness and headaches.  Psychiatric/Behavioral: Negative for suicidal ideas, sleep disturbance, self-injury and dysphoric mood. The patient is nervous/anxious.     Past Medical History  Diagnosis Date  . Allergy   . Anxiety     with panic attacks  . GERD (gastroesophageal reflux disease)   . Depression   . Benign neoplasm of skin, site unspecified   . Tobacco use disorder   . Dysfunctional alcohol use   . Insomnia, unspecified   . Dyspepsia and other specified disorders of function of stomach   . Breast lump     Right  . Chronic migraine without aura, with intractable migraine, so stated, without mention of status migrainosus 08/21/2012  . Thyroid disease     Hypothyroidism   Past Surgical History  Procedure Laterality Date  . Abdominal hysterectomy  2008    Right ovary removed Left ovary intact   Allergies  Allergen Reactions  . Sulfa Antibiotics Nausea And Vomiting  . Codeine Hives    Social History   Social  History  . Marital Status: Married    Spouse Name: Gershon Mussel  . Number of Children: 2  . Years of Education: BA   Occupational History  . [place]    [employer]  . [place] History Main Topics  . Smoking status: Former  Smoker -- 0.50 packs/day for 25 years    Types: Cigarettes    Quit date: 08/22/2005  . Smokeless tobacco: Never Used  . Alcohol Use: 1.2 oz/week    2 Standard drinks or equivalent per week     Comment: socially  . Drug Use: No  . Sexual Activity: Yes     Comment: Hysterectomy   Other Topics Concern  . Not on file   Social History Narrative   Marital status: married      Children:  2 adopted sons (18, 39)      Lives with: husband, 2 sons      Employment: works Careers adviser      Tobacco: former smoker; quit 2007      Alcohol: socially      Drugs: none      Exercise: daily   Caffeine Use: 1 cup of coffee in the a.m.; 8oz of tea at lunch   Family History  Problem Relation Age of Onset  . Arthritis Mother     rheumatoid  . Mental illness Mother   . Cancer Father     lung  . Cancer Maternal Grandmother   . Cancer Maternal Grandfather   . Cancer Paternal Grandfather        Objective:    BP 122/80 mmHg  Pulse 85  Temp(Src) 97.9 F (36.6 C) (Oral)  Resp 16  Wt 154 lb (69.854 kg) Physical Exam  Constitutional: She is oriented to person, place, and time. She appears well-developed and well-nourished. No distress.  HENT:  Head: Normocephalic and atraumatic.  Right Ear: External ear normal.  Left Ear: External ear normal.  Nose: Mucosal edema and rhinorrhea present. Right sinus exhibits maxillary sinus tenderness and frontal sinus tenderness. Left sinus exhibits maxillary sinus tenderness and frontal sinus tenderness.  Mouth/Throat: Posterior oropharyngeal erythema present. No oropharyngeal exudate.  Eyes: Conjunctivae and EOM are normal. Pupils are equal, round, and reactive to light.  Neck: Normal range of motion. Neck supple. Carotid bruit is not present. No thyromegaly present.  Cardiovascular: Normal rate, regular rhythm, normal heart sounds and intact distal pulses.  Exam reveals no gallop and no friction rub.   No murmur heard. Pulmonary/Chest: Effort normal. She has  wheezes. She has no rales.  Abdominal: Soft. Bowel sounds are normal. She exhibits no distension and no mass. There is no tenderness. There is no rebound and no guarding.  Lymphadenopathy:    She has no cervical adenopathy.  Neurological: She is alert and oriented to person, place, and time. No cranial nerve deficit.  Skin: Skin is warm and dry. No rash noted. She is not diaphoretic. No erythema. No pallor.  Psychiatric: She has a normal mood and affect. Her behavior is normal.    [facility] reading (PRIMARY) by  Dr. Tamala Julian.  CXR: NAD; hyperexpanded.  ALBUTEROL NEBULIZER ADMINISTERED.      Assessment & Plan:   1. Acute recurrent maxillary sinusitis   2. Bronchospasm   3. Insomnia   4. Anxiety   5. Intractable chronic migraine without aura and without status migrainosus   6. Hypothyroidism due to acquired atrophy of thyroid   7. Allergic rhinitis due  to pollen   8. Fever, unspecified   9. Non-intractable vomiting with nausea, unspecified vomiting type     1. Acute maxillary sinusitis: New. Rx for Levaquin. 2.  Bronchospasm: New.  S/p Albuterol neb in office; rx for Prednisone and Albuterol.  Tussionex, Mucinex DM. 3.  Insomnia; improved; using Ambien sparingly. 4.  Anxiety: stable; refill provided; no changes to management. RTC six months. 5.  Migraines: improved off of prophylactic medication. 6. Hypothyroidism: controlled; obtain labs; refill provided. 7.  Allergic Rhinitis: controlled; no changes to management.  8.  Vomiting with diarrhea: New. BRAT diet, hydration; obtain labs.   Orders Placed This Encounter  Procedures  . DG Chest 2 View    Standing Status: Future     Number of Occurrences: 1     Standing Expiration Date: 02/13/2016    Order Specific Question:  Reason for Exam (SYMPTOM  OR DIAGNOSIS REQUIRED)    Answer:  cough for one month; wheezing; fever to 103    Order Specific Question:  Is the patient pregnant?    Answer:  No    Order Specific Question:  Preferred  imaging location?    Answer:  External  . Comprehensive metabolic panel  . TSH  . T4, free  . POCT Influenza A/B  . POCT CBC   Meds ordered this encounter  Medications  . albuterol (PROVENTIL) (2.5 MG/3ML) 0.083% nebulizer solution 2.5 mg    Sig:   . predniSONE (DELTASONE) 20 MG tablet    Sig: Three tablets daily x 2 days then two tablets daily x 5 days then one tablet daily x 5 days    Dispense:  21 tablet    Refill:  0  . levofloxacin (LEVAQUIN) 750 MG tablet    Sig: Take 1 tablet (750 mg total) by mouth daily.    Dispense:  10 tablet    Refill:  0  . chlorpheniramine-HYDROcodone (TUSSIONEX PENNKINETIC ER) 10-8 MG/5ML SUER    Sig: Take 5 mLs by mouth every 12 (twelve) hours as needed for cough.    Dispense:  180 mL    Refill:  0  . Dextromethorphan-Guaifenesin (MUCINEX DM) 30-600 MG TB12    Sig: Take 1 tablet by mouth 2 (two) times daily as needed.    Dispense:  28 each    Refill:  0  . ALPRAZolam (XANAX) 1 MG tablet    Sig: Take 1 tablet (1 mg total) by mouth 3 (three) times daily as needed for anxiety.    Dispense:  90 tablet    Refill:  5  . zolpidem (AMBIEN) 5 MG tablet    Sig: Take 1 tablet (5 mg total) by mouth at bedtime as needed for sleep.    Dispense:  30 tablet    Refill:  2  . albuterol (PROVENTIL HFA;VENTOLIN HFA) 108 (90 BASE) MCG/ACT inhaler    Sig: Inhale 2 puffs into the lungs every 6 (six) hours as needed for wheezing or shortness of breath (cough, shortness of breath or wheezing.).    Dispense:  1 Inhaler    Refill:  0    Return in about 6 months (around 08/13/2015) for recheck.    Kristi Elayne Guerin, M.D. [facility] [address] [place], [place]  [address] [phone] phone [phone] fax

## 2015-02-14 ENCOUNTER — Encounter: Payer: Self-pay | Admitting: Family Medicine

## 2015-02-15 MED ORDER — FLUCONAZOLE 150 MG PO TABS
150.0000 mg | ORAL_TABLET | Freq: Once | ORAL | Status: DC
Start: 2015-02-15 — End: 2015-10-20

## 2015-02-15 MED ORDER — LEVOTHYROXINE SODIUM 88 MCG PO TABS: 88.0000 ug | ORAL_TABLET | Freq: Every day | ORAL | Status: DC

## 2015-02-15 MED ORDER — FLUTICASONE PROPIONATE 50 MCG/ACT NA SUSP: NASAL | Status: AC

## 2015-02-15 MED ORDER — ONDANSETRON 8 MG PO TBDP: 8.0000 mg | ORAL_TABLET | Freq: Three times a day (TID) | ORAL | Status: AC | PRN

## 2015-02-15 MED ORDER — AZELASTINE HCL 0.1 % NA SOLN: 2.0000 | Freq: Two times a day (BID) | NASAL | Status: AC

## 2015-04-09 ENCOUNTER — Encounter: Payer: Self-pay | Admitting: Family Medicine

## 2015-04-20 ENCOUNTER — Encounter: Payer: Self-pay | Admitting: Family Medicine

## 2015-04-21 NOTE — Telephone Encounter (Signed)
Please print letter that I created to patient's employer. Please contact patient to clarify what fax number to send letter.

## 2015-04-29 ENCOUNTER — Telehealth: Payer: Self-pay

## 2015-04-29 NOTE — Telephone Encounter (Signed)
Patient faxed over FMLA forms to be completed by Dr Tamala Julian, I have completed what I could and highlighted the rest, after reading the message attached to the paperwork I wasn't sure about some of the things it was asking so I thought it best to let you fill out the forms so I did not mess them up. Please completed all the pages there are quite a few and return to the FMLA/Disability box at the 102 checkout desk with in 5-7 business days. I will place the forms in your box on 04/29/15. Thank you!!
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
## 2015-05-05 ENCOUNTER — Encounter: Payer: Self-pay | Admitting: Family Medicine

## 2015-05-06 ENCOUNTER — Encounter: Payer: Self-pay | Admitting: Family Medicine

## 2015-05-08 NOTE — Telephone Encounter (Signed)
Did we get these forms completed today is the 7th business days and I do not have a records of the completed forms anywhere. Please let me know thank you!

## 2015-05-11 ENCOUNTER — Telehealth: Payer: Self-pay | Admitting: *Deleted

## 2015-05-11 ENCOUNTER — Ambulatory Visit: Payer: No Typology Code available for payment source | Admitting: Family Medicine

## 2015-05-11 NOTE — Telephone Encounter (Signed)
Spoke with the patient regarding FMLA paperwork, per Dr. Tamala Julian paperwork will be faxed over. Pt advised paperwork will not be faxed right this moment because provider is seeing patients. Pt is fine with this. Pt appointment has been cancelled for today at 3:00 pm.

## 2015-06-02 ENCOUNTER — Encounter: Payer: Self-pay | Admitting: Family Medicine

## 2015-06-10 ENCOUNTER — Telehealth: Payer: Self-pay

## 2015-06-10 NOTE — Telephone Encounter (Signed)
Patient would like an appointment with Dr. Tamala Julian in April. There are same day appointments left and the patient will be out of her medication by then. Patient doesn't want to call 24 hours before the schedule a same day appointment because she'll be busy with work. Patient would like to see if she can still be scheduled in April or early May. Please call!  [phone]

## 2015-06-10 NOTE — Telephone Encounter (Signed)
I have added the patient to the wait list for Dr. Tamala Julian, since there are no available slots for March or April at this time.

## 2015-07-10 ENCOUNTER — Telehealth: Payer: Self-pay

## 2015-07-10 MED ORDER — LEVOTHYROXINE SODIUM 88 MCG PO TABS: 88.0000 ug | ORAL_TABLET | Freq: Every day | ORAL | Status: DC

## 2015-07-10 NOTE — Telephone Encounter (Signed)
[facility] is calling in a refill request on patient thyroid medicaiton

## 2015-07-10 NOTE — Telephone Encounter (Signed)
Rx sent 

## 2015-07-27 ENCOUNTER — Encounter: Payer: Self-pay | Admitting: Family Medicine

## 2015-07-27 MED ORDER — LEVOTHYROXINE SODIUM 88 MCG PO TABS: 88.0000 ug | ORAL_TABLET | Freq: Every day | ORAL | Status: DC

## 2015-07-27 MED ORDER — ALPRAZOLAM 1 MG PO TABS: 1.0000 mg | ORAL_TABLET | Freq: Three times a day (TID) | ORAL | Status: DC | PRN

## 2015-07-27 NOTE — Telephone Encounter (Signed)
Please call in/fax refill of Xanax as approved.

## 2015-07-28 NOTE — Telephone Encounter (Signed)
Faxed xanax 

## 2015-08-11 ENCOUNTER — Other Ambulatory Visit: Payer: Self-pay | Admitting: Family Medicine

## 2015-08-13 ENCOUNTER — Encounter: Payer: Self-pay | Admitting: Family Medicine

## 2015-08-17 ENCOUNTER — Other Ambulatory Visit: Payer: Self-pay | Admitting: Family Medicine

## 2015-10-20 ENCOUNTER — Encounter: Payer: Self-pay | Admitting: Family Medicine

## 2015-10-20 ENCOUNTER — Ambulatory Visit (INDEPENDENT_AMBULATORY_CARE_PROVIDER_SITE_OTHER): Payer: Self-pay | Admitting: Family Medicine

## 2015-10-20 VITALS — BP 132/76 | HR 92 | Temp 98.3°F | Resp 16 | Ht 68.75 in | Wt 157.6 lb

## 2015-10-20 DIAGNOSIS — G43719 Chronic migraine without aura, intractable, without status migrainosus: Secondary | ICD-10-CM

## 2015-10-20 DIAGNOSIS — E038 Other specified hypothyroidism: Secondary | ICD-10-CM

## 2015-10-20 DIAGNOSIS — F419 Anxiety disorder, unspecified: Secondary | ICD-10-CM

## 2015-10-20 DIAGNOSIS — J301 Allergic rhinitis due to pollen: Secondary | ICD-10-CM

## 2015-10-20 DIAGNOSIS — E034 Atrophy of thyroid (acquired): Secondary | ICD-10-CM

## 2015-10-20 DIAGNOSIS — K219 Gastro-esophageal reflux disease without esophagitis: Secondary | ICD-10-CM

## 2015-10-20 DIAGNOSIS — G4700 Insomnia, unspecified: Secondary | ICD-10-CM

## 2015-10-20 MED ORDER — ALPRAZOLAM 1 MG PO TABS: 1.0000 mg | ORAL_TABLET | Freq: Three times a day (TID) | ORAL | Status: DC | PRN

## 2015-10-20 MED ORDER — LEVOTHYROXINE SODIUM 88 MCG PO TABS: 88.0000 ug | ORAL_TABLET | Freq: Every day | ORAL | Status: DC

## 2015-10-20 NOTE — Patient Instructions (Signed)
     IF you received an x-ray today, you will receive an invoice from [facility]. Please contact [facility] at [phone] with questions or concerns regarding your invoice.   IF you received labwork today, you will receive an invoice from [facility]. Please contact [facility] at [phone] with questions or concerns regarding your invoice.   Our billing staff will not be able to assist you with questions regarding bills from these companies.  You will be contacted with the lab results as soon as they are available. The fastest way to get your results is to activate your My Chart account. Instructions are located on the last page of this paperwork. If you have not heard from us regarding the results in 2 weeks, please contact this office.      

## 2015-10-20 NOTE — Progress Notes (Signed)
Subjective:    Patient ID: Amber Gibbs, female    DOB: [birth year], 50 y.o.   MRN: [id number]  10/20/2015  Medication Refill (xanax, levothyroxine and zofran-dot)   HPI This 50 y.o. female presents for six month follow-up:  1.  Anxiety: Patient reports good compliance with medication, good tolerance to medication, and good symptom control.  Provided unemployment and severence package.  Stayed sick for four months; feels stress related from work stressors.  Worked in [employer] building.  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  Programs codes into machines.  [employer] control.  Cried all the time.  Would get up some mornings and would vomit before working.  Started documenting when Psychologist, educational moved in.  Jeris Penta to county and complained.  Called in coworkers and validated everything.  Also had emails to support it all.  Paid off all debt; has a nest egg.  Was engaged; fiance wanted to set the date.  Did not go to work one Friday; he decided to get married.  Was day that lost son; died on 10-18-15.  Really happy.  Has never not had a job.  At first did not know what to do.  Has a side business by word of mouth; working outside a lot; pressure washing houses; Grew up on a farm; working hard.  [place] work.  Sister-in-law has a pool.  Deserves happiness.  Started getting serious; do not hire new staff until after July 1, 0000000; has 70 applications in.  Living in [facility] outside [place].  Commuted 1.5 hours each way; looking at counties around immediate area.  Xanax bid to tid.  Gershon Mussel and [place] are in [place]; stays with patient every other weekend.  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    2.  Insomnia: Patient reports good compliance with medication, good tolerance to medication, and good symptom control.  Not taking Ambien.   3.  Hypothyroidism: Patient reports good compliance with medication, good tolerance to medication, and good symptom control.    4. Allergic Rhinitis: Patient reports  good compliance with medication, good tolerance to medication, and good symptom control.    5. Migraine: one migraine in past six months.    Review of Systems  Constitutional: Negative for fever, chills, diaphoresis and fatigue.  Eyes: Negative for visual disturbance.  Respiratory: Negative for cough and shortness of breath.   Cardiovascular: Negative for chest pain, palpitations and leg swelling.  Gastrointestinal: Negative for nausea, vomiting, abdominal pain, diarrhea and constipation.  Endocrine: Negative for cold intolerance, heat intolerance, polydipsia, polyphagia and polyuria.  Neurological: Negative for dizziness, tremors, seizures, syncope, facial asymmetry, speech difficulty, weakness, light-headedness, numbness and headaches.    Past Medical History:  Diagnosis Date  . Allergy   . Anxiety    with panic attacks  . Benign neoplasm of skin, site unspecified   . Breast lump    Right  . Chronic migraine without aura, with intractable migraine, so stated, without mention of status migrainosus 08/21/2012  . Depression   . Dysfunctional alcohol use   . Dyspepsia and other specified disorders of function of stomach   . GERD (gastroesophageal reflux disease)   . Insomnia, unspecified   . Thyroid disease    Hypothyroidism  . Tobacco use disorder    Past Surgical History:  Procedure Laterality Date  . ABDOMINAL HYSTERECTOMY  2008   Right ovary removed Left ovary intact   Allergies  Allergen Reactions  . Sulfa Antibiotics Nausea And Vomiting  .  Codeine Hives   Current Outpatient Prescriptions  Medication Sig Dispense Refill  . levothyroxine (SYNTHROID, LEVOTHROID) 88 MCG tablet Take 1 tablet (88 mcg total) by mouth daily before breakfast. 30 tablet 5  . ondansetron (ZOFRAN-ODT) 8 MG disintegrating tablet Take 1 tablet (8 mg total) by mouth every 8 (eight) hours as needed for nausea or vomiting. 21 tablet 1  . albuterol (PROVENTIL HFA;VENTOLIN HFA) 108 (90 BASE) MCG/ACT  inhaler Inhale 2 puffs into the lungs every 6 (six) hours as needed for wheezing or shortness of breath (cough, shortness of breath or wheezing.). (Patient not taking: Reported on 10/20/2015) 1 Inhaler 0  . ALPRAZolam (XANAX) 1 MG tablet Take 1 tablet (1 mg total) by mouth 3 (three) times daily as needed for anxiety. 90 tablet 5  . azelastine (ASTELIN) 0.1 % nasal spray Place 2 sprays into both nostrils 2 (two) times daily. Use in each nostril as directed (Patient not taking: Reported on 10/20/2015) 30 mL 11  . fluticasone (FLONASE) 50 MCG/ACT nasal spray place 2 sprays into each nostril twice daily (Patient not taking: Reported on 10/20/2015) 16 g 11  . SUMAtriptan (IMITREX) 100 MG tablet take 1 tablet by mouth if needed for pain . MAY REPEAT IN TWO HOURS.DO NOT EXCEED 2 TABLETS IN 43 HOUR PERIOD (Patient not taking: Reported on 02/13/2015) 9 tablet 3  . vitamin E 200 UNIT capsule Take 200 Units by mouth daily. Reported on 10/20/2015    . zolpidem (AMBIEN) 5 MG tablet Take 1 tablet (5 mg total) by mouth at bedtime as needed for sleep. (Patient not taking: Reported on 10/20/2015) 30 tablet 2   No current facility-administered medications for this visit.    Social History   Social History  . Marital status: Married    Spouse name: Gershon Mussel  . Number of children: 2  . Years of education: BA   Occupational History  . [place]    [employer]  . [place] History Main Topics  . Smoking status: Former Smoker    Packs/day: 0.50    Years: 25.00    Types: Cigarettes    Quit date: 08/22/2005  . Smokeless tobacco: Never Used  . Alcohol use 1.2 oz/week    2 Standard drinks or equivalent per week     Comment: socially  . Drug use: No  . Sexual activity: Yes     Comment: Hysterectomy   Other Topics Concern  . Not on file   Social History Narrative   Marital status: married      Children:  2 adopted sons (18, 73)      Lives with:  husband, 2 sons      Employment: works Careers adviser      Tobacco: former smoker; quit 2007      Alcohol: socially      Drugs: none      Exercise: daily   Caffeine Use: 1 cup of coffee in the a.m.; 8oz of tea at lunch   Family History  Problem Relation Age of Onset  . Arthritis Mother     rheumatoid  . Mental illness Mother   . Cancer Father     lung  . Cancer Maternal Grandmother   . Cancer Maternal Grandfather   . Cancer Paternal Grandfather        Objective:    BP 132/76   Pulse 92   Temp 98.3 F (36.8 C) (Oral)   Resp 16  Ht 5' 8.75" (1.746 m)   Wt 157 lb 9.6 oz (71.5 kg)   SpO2 98%   BMI 23.44 kg/m  Physical Exam  Constitutional: She is oriented to person, place, and time. She appears well-developed and well-nourished. No distress.  HENT:  Head: Normocephalic and atraumatic.  Right Ear: External ear normal.  Left Ear: External ear normal.  Nose: Nose normal.  Mouth/Throat: Oropharynx is clear and moist.  Eyes: Conjunctivae and EOM are normal. Pupils are equal, round, and reactive to light.  Neck: Normal range of motion. Neck supple. Carotid bruit is not present. No thyromegaly present.  Cardiovascular: Normal rate, regular rhythm, normal heart sounds and intact distal pulses.  Exam reveals no gallop and no friction rub.   No murmur heard. Pulmonary/Chest: Effort normal and breath sounds normal. She has no wheezes. She has no rales.  Abdominal: Soft. Bowel sounds are normal. She exhibits no distension and no mass. There is no tenderness. There is no rebound and no guarding.  Lymphadenopathy:    She has no cervical adenopathy.  Neurological: She is alert and oriented to person, place, and time. No cranial nerve deficit.  Skin: Skin is warm and dry. No rash noted. She is not diaphoretic. No erythema. No pallor.  Psychiatric: She has a normal mood and affect. Her behavior is normal.        Assessment & Plan:   1. Anxiety   2. Insomnia   3. Intractable  chronic migraine without aura and without status migrainosus   4. Hypothyroidism due to acquired atrophy of thyroid   5. Gastroesophageal reflux disease without esophagitis   6. Allergic rhinitis due to pollen    -stable; doing really well emotionally. -refill of Xanax provided at tid dosing which has been chronic for patient. -refill of synthroid also provided. -migraines are rare. -allergies are under excellent control.   No orders of the defined types were placed in this encounter.  Meds ordered this encounter  Medications  . ALPRAZolam (XANAX) 1 MG tablet    Sig: Take 1 tablet (1 mg total) by mouth 3 (three) times daily as needed for anxiety.    Dispense:  90 tablet    Refill:  5  . levothyroxine (SYNTHROID, LEVOTHROID) 88 MCG tablet    Sig: Take 1 tablet (88 mcg total) by mouth daily before breakfast.    Dispense:  30 tablet    Refill:  5    Generic is fine    Return in about 6 months (around 04/21/2016) for recheck.    Aurorah Schlachter Elayne Guerin, M.D. [facility] [address] [place], [place]  [address] [phone] phone [phone] fax

## 2016-04-27 ENCOUNTER — Encounter: Payer: Self-pay | Admitting: Family Medicine

## 2016-04-28 MED ORDER — LEVOTHYROXINE SODIUM 88 MCG PO TABS: 88.0000 ug | ORAL_TABLET | Freq: Every day | ORAL | 5 refills | Status: AC

## 2016-04-28 MED ORDER — ALPRAZOLAM 1 MG PO TABS: 1.0000 mg | ORAL_TABLET | Freq: Two times a day (BID) | ORAL | 1 refills | Status: AC

## 2016-04-28 NOTE — Telephone Encounter (Signed)
Xanax called into [facility] in [place] for bid dosing which is a decrease from tid dosing; pt advised via MyChart.  Levothyroxine escribed.

## 2016-05-07 IMAGING — CR DG CHEST 2V
3 series · 3 of 3 positions shown · non-contrast
Comparison: 05/06/2012 and earlier.

CLINICAL DATA: 49-year-old with 1 month history of cough. Acute
fever and wheezing.

EXAM:
CHEST  2 VIEW

[PA (1 of 2)]
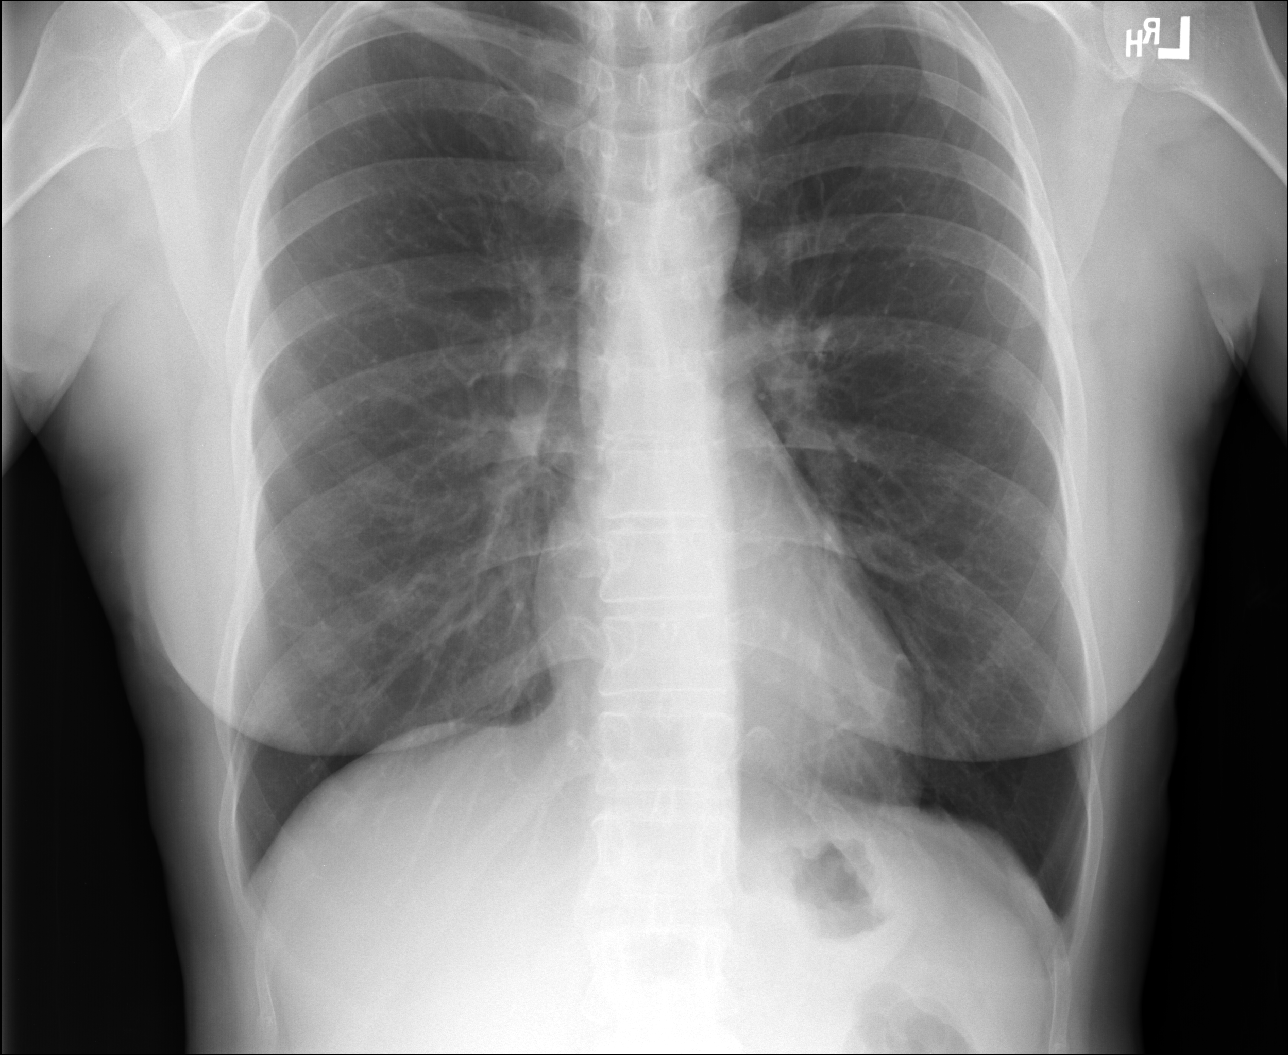

[PA (2 of 2)]
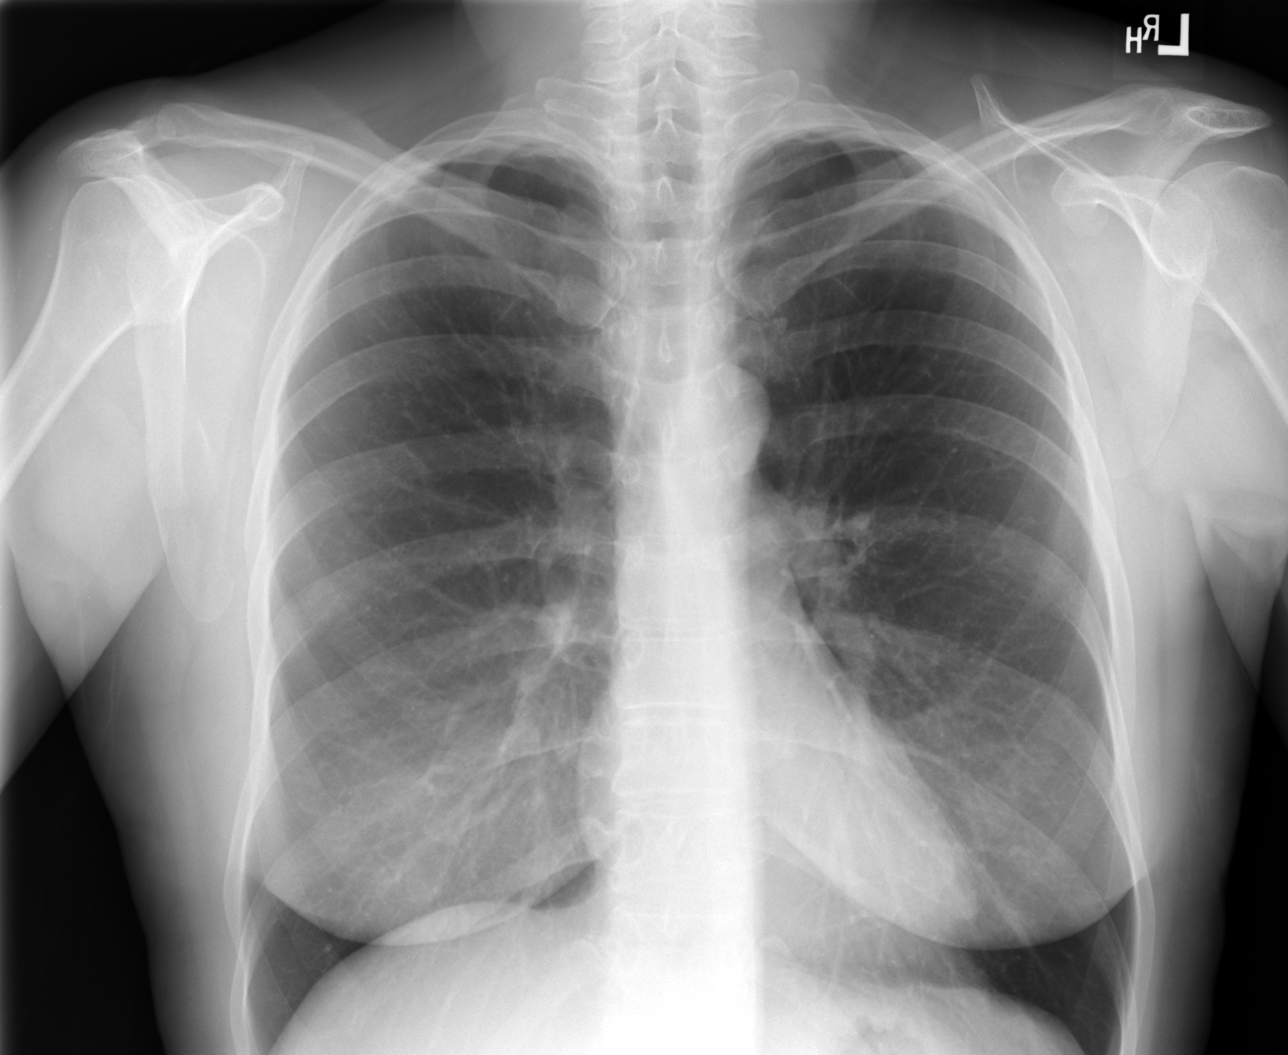

[lateral]
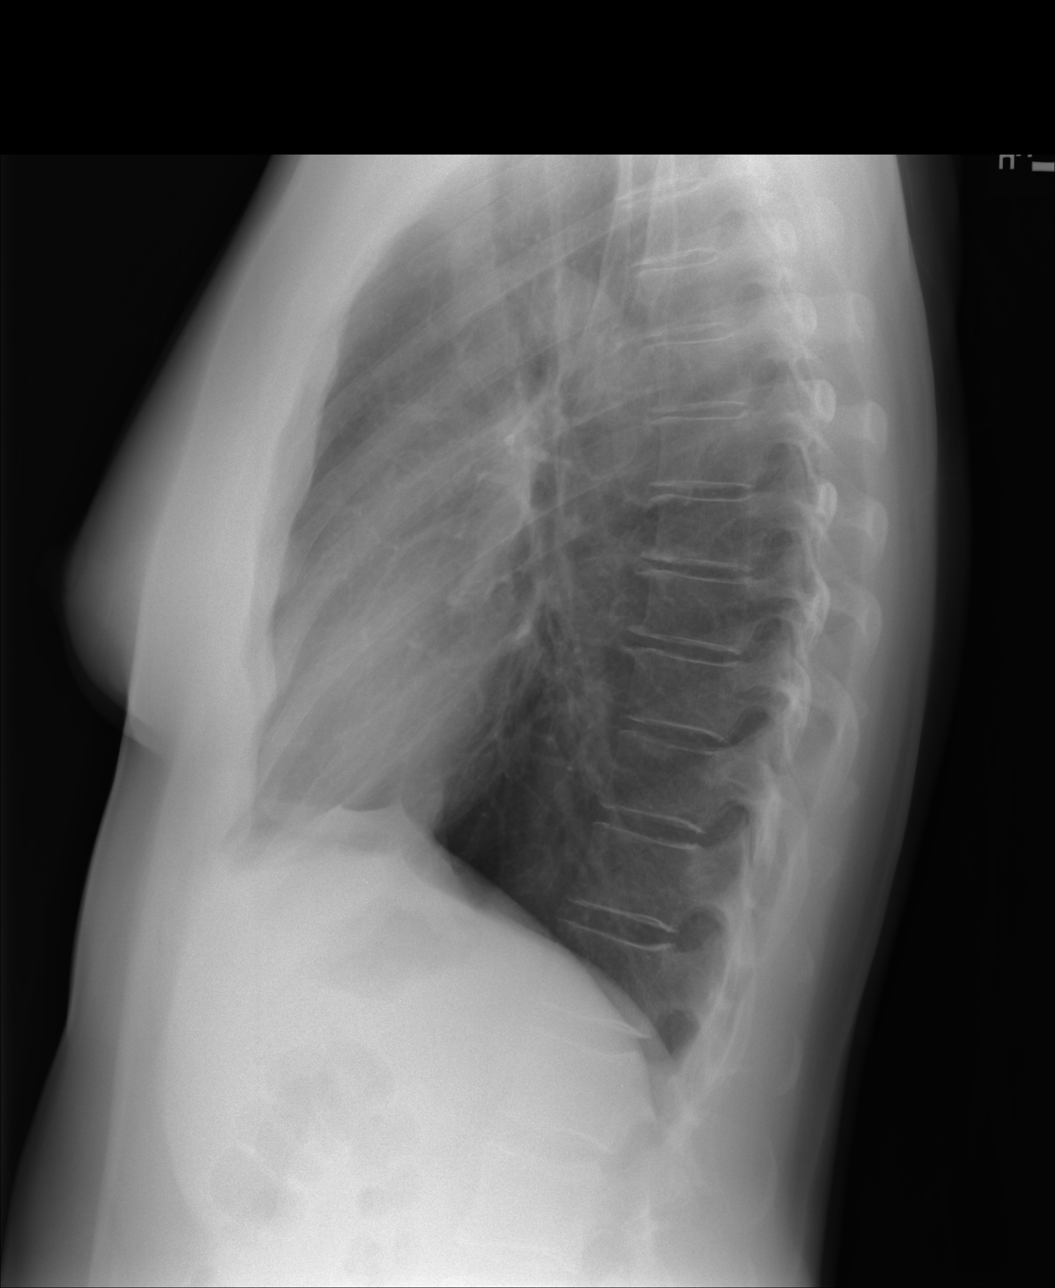

[3 of 3 positions shown; findings below may reference images not displayed]

FINDINGS: Cardiomediastinal silhouette unremarkable, unchanged. Mild
hyperinflation, unchanged. Lungs clear. Bronchovascular markings
normal. Pulmonary vascularity normal. No visible pleural effusions.
No pneumothorax. Mild degenerative disc disease and spondylosis
involving the mid thoracic spine. No significant interval change.
IMPRESSION: Hyperinflation consistent with COPD and/or asthma. No acute
cardiopulmonary disease. Stable examination.

## 2016-05-17 ENCOUNTER — Encounter: Payer: Self-pay | Admitting: Family Medicine

## 2017-08-31 ENCOUNTER — Encounter: Payer: Self-pay | Admitting: Family Medicine
